# Patient Record
Sex: Female | Born: 1970 | Race: Black or African American | Hispanic: No | Marital: Married | Smoking: Never smoker
Health system: Southern US, Community
[De-identification: ages and names within clinical notes are randomized; demographics above are authoritative.]

## PROBLEM LIST (undated history)

## (undated) DIAGNOSIS — I10 Essential (primary) hypertension: Secondary | ICD-10-CM

## (undated) DIAGNOSIS — D649 Anemia, unspecified: Secondary | ICD-10-CM

## (undated) HISTORY — PX: OTHER SURGICAL HISTORY: SHX169

## (undated) HISTORY — PX: ABDOMINAL HYSTERECTOMY: SHX81

---

## 1998-11-18 ENCOUNTER — Ambulatory Visit (HOSPITAL_COMMUNITY): Admission: RE | Admit: 1998-11-18 | Discharge: 1998-11-18 | Payer: Self-pay | Admitting: Obstetrics and Gynecology

## 1998-11-21 ENCOUNTER — Encounter: Payer: Self-pay | Admitting: Obstetrics and Gynecology

## 1999-03-18 ENCOUNTER — Inpatient Hospital Stay (HOSPITAL_COMMUNITY): Admission: AD | Admit: 1999-03-18 | Discharge: 1999-03-18 | Payer: Self-pay | Admitting: Obstetrics and Gynecology

## 1999-03-24 ENCOUNTER — Inpatient Hospital Stay (HOSPITAL_COMMUNITY): Admission: AD | Admit: 1999-03-24 | Discharge: 1999-03-24 | Payer: Self-pay | Admitting: Obstetrics and Gynecology

## 1999-04-10 ENCOUNTER — Inpatient Hospital Stay (HOSPITAL_COMMUNITY): Admission: AD | Admit: 1999-04-10 | Discharge: 1999-04-13 | Payer: Self-pay | Admitting: Obstetrics and Gynecology

## 2001-08-17 ENCOUNTER — Other Ambulatory Visit: Admission: RE | Admit: 2001-08-17 | Discharge: 2001-08-17 | Payer: Self-pay | Admitting: Obstetrics and Gynecology

## 2002-03-29 ENCOUNTER — Inpatient Hospital Stay (HOSPITAL_COMMUNITY): Admission: AD | Admit: 2002-03-29 | Discharge: 2002-03-29 | Payer: Self-pay | Admitting: Obstetrics and Gynecology

## 2002-04-17 ENCOUNTER — Encounter: Payer: Self-pay | Admitting: Obstetrics and Gynecology

## 2002-04-17 ENCOUNTER — Ambulatory Visit (HOSPITAL_COMMUNITY): Admission: RE | Admit: 2002-04-17 | Discharge: 2002-04-17 | Payer: Self-pay | Admitting: Obstetrics and Gynecology

## 2002-09-07 ENCOUNTER — Encounter (INDEPENDENT_AMBULATORY_CARE_PROVIDER_SITE_OTHER): Payer: Self-pay

## 2002-09-07 ENCOUNTER — Inpatient Hospital Stay (HOSPITAL_COMMUNITY): Admission: AD | Admit: 2002-09-07 | Discharge: 2002-09-09 | Payer: Self-pay | Admitting: Obstetrics and Gynecology

## 2003-08-25 ENCOUNTER — Emergency Department (HOSPITAL_COMMUNITY): Admission: AD | Admit: 2003-08-25 | Discharge: 2003-08-25 | Payer: Self-pay | Admitting: Emergency Medicine

## 2004-05-09 ENCOUNTER — Other Ambulatory Visit: Admission: RE | Admit: 2004-05-09 | Discharge: 2004-05-09 | Payer: Self-pay | Admitting: Obstetrics and Gynecology

## 2005-06-05 ENCOUNTER — Other Ambulatory Visit: Admission: RE | Admit: 2005-06-05 | Discharge: 2005-06-05 | Payer: Self-pay | Admitting: Obstetrics and Gynecology

## 2008-07-19 HISTORY — PX: DILATION AND CURETTAGE OF UTERUS: SHX78

## 2008-07-20 ENCOUNTER — Encounter (INDEPENDENT_AMBULATORY_CARE_PROVIDER_SITE_OTHER): Payer: Self-pay | Admitting: Obstetrics and Gynecology

## 2008-07-20 ENCOUNTER — Ambulatory Visit (HOSPITAL_COMMUNITY): Admission: RE | Admit: 2008-07-20 | Discharge: 2008-07-20 | Payer: Self-pay | Admitting: Obstetrics and Gynecology

## 2011-03-03 NOTE — Op Note (Signed)
NAMESARRA, Tammy Brock                ACCOUNT NO.:  [id number]   MEDICAL RECORD NO.:  [id number]          PATIENT TYPE:  AMB   LOCATION:  SDC                           FACILITY:  [facility]   PHYSICIAN:  Zenaida Niece, M.D.DATE OF BIRTH:  [birth year]   DATE OF PROCEDURE:  07/20/2008  DATE OF DISCHARGE:                               OPERATIVE REPORT   PREOPERATIVE DIAGNOSES:  Abnormal uterine bleeding and endometrial  lesion.   POSTOPERATIVE DIAGNOSES:  Abnormal uterine bleeding and endometrial  lesion.   PROCEDURES:  Hysteroscopy with resection of endometrial lesion, dilation  and curettage, and NovaSure endometrial ablation.   SURGEON:  Zenaida Niece, MD   ANESTHESIA:  Monitored anesthesia care and paracervical block.   FINDINGS:  She had a large irregular endometrial lesion.  The uterus  sounded to 10 cm and cervix measured 3.5 cm, so the NovaSure device used  a depth of 6.5 cm, a width of 4.7 cm, and used 168 watts for 45 seconds.   SPECIMENS:  Endometrial resection and curettings sent to pathology.   ESTIMATED BLOOD LOSS:  Minimal.   FLUID DEFICIT:  Through the hysteroscope approximately 175 mL.   COMPLICATIONS:  None.   PROCEDURE IN DETAIL:  The patient was taken to the operating room and  placed in the dorsal supine position.  She was given IV sedation and  Toradol and placed in mobile stirrups.  Perineum and vagina were then  prepped and draped in the usual sterile fashion and bladder drained with  a latex-free catheter.  A Graves speculum was inserted into the vagina.  The anterior lip of the cervix was grasped with a single-tooth  tenaculum.  Paracervical block was then performed with 16 mL of 2% plain  lidocaine.  Uterus initially sounded to 7 cm, but after dilating sounded  to 10 cm.  The cervix was gradually easily dilated to a size 19 dilator  and the cervix measured 3.5 cm.  The observer hysteroscope was inserted  and fair visualization was achieved.  There was  a fair amount of  bleeding and there did appear to be a fairly large endometrial lesion.  This scope was removed and the resectoscope was put together and  inserted.  With this, I achieved better visualization and was able to  see a large irregular lesion arising from the posterior portion of the  uterus.  This was resected using a single loop with cutting current.  The lesion was removed mostly intact.  Hysteroscope was reinserted and  small pieces of the lesion were removed.  A small remainder was shaved  down to the remainder of the endometrial bed.  No further lesions were  identified.  The hysteroscope was removed.  Curettage was then performed  with return of a moderate amount of tissue, and the resection were sent  together.  Hysteroscope was reinserted and there were no further  lesions.  Sorbitol had been used for the resection, so the uterus was  now cleared with lactated Ringer's.  The hysteroscope was then removed.  The NovaSure device was then inserted and deployed appropriately.  The  CO2 test passed.  Endometrial ablation was performed with the above-  mentioned settings without difficulty.  The NovaSure device was then  removed intact.  Once the NovaSure was completed, the hysteroscope was  reinserted, good visualization was achieved.  It appeared that there was  good ablation of the entire cavity.  The single-tooth tenaculum was then  removed and bleeding controlled with pressure.  All instruments were  then removed from the vagina.  The patient was taken down from stirrups.  She tolerated the procedure well and was taken to the recovery room in  stable condition.  Counts were correct and she had PAS hose on  throughout the procedure.      Zenaida Niece, M.D.  Electronically Signed     TDM/MEDQ  D:  07/20/2008  T:  07/20/2008  Job:  [id number]

## 2011-03-06 NOTE — H&P (Signed)
NAME:  Tammy Brock, Tammy Brock                          ACCOUNT NO.:  [id number]   MEDICAL RECORD NO.:  [id number]                   PATIENT TYPE:  INP   LOCATION:  NA                                   FACILITY:  [facility]   PHYSICIAN:  Zenaida Niece, M.D.             DATE OF BIRTH:  [birth year]   DATE OF ADMISSION:  09/08/2002  DATE OF DISCHARGE:                                HISTORY & PHYSICAL   CHIEF COMPLAINT:  Repeat cesarean section.   HISTORY OF PRESENT ILLNESS:  This is a 40 year old black female gravida 3  para 1-1-0-2 with an EGA of [redacted] weeks by a seven-week ultrasound with a due  date of September 15, 2002 who presents for repeat cesarean section.  The  patient has one prior cesarean section, is cleared for a trial of labor, and  debated whether or not to have a vaginal delivery.  She has elected to  proceed with a repeat cesarean section.  She has had good fetal movement, no  bleeding or rupture of membranes, and occasional contractions.  Prenatal  care complicated by a first trimester urinary tract infection with E. coli  treated with Macrobid with a negative test of cure.  She was seen in the  emergency room at 15 weeks for abdominal pain and diarrhea which resolved.  She has had no further significant complications.   PRENATAL LABORATORY DATA:  Blood type O positive with a negative antibody  screen.  RPR nonreactive.  Rubella equivocal.  Hepatitis B surface antigen  negative.  HIV negative.  Gonorrhea and chlamydia negative.  Triple screen  normal.  One-hour Glucola is 83 and group B strep is negative.   PAST OBSTETRICAL HISTORY:  In 1991 she had a vaginal delivery at 34 weeks.  The baby weighed 4 pounds 8 ounces complicated by preterm labor.  In June  2000 she had a low transverse cesarean section at 39 weeks for arrest of  descent.  The baby weighed 7 pounds 13 ounces.   PAST MEDICAL HISTORY:  Negative.   PAST SURGICAL HISTORY:  In 1995 she had a ganglion cyst removed from  her  left wrist x2.   ALLERGIES:  None known.   CURRENT MEDICATIONS:  None.   SOCIAL HISTORY:  The patient is married and denies alcohol, tobacco, or drug  use.   FAMILY HISTORY:  Negative.   PHYSICAL EXAMINATION:  VITAL SIGNS:  Weight 183 pounds, blood pressure  118/70.  GENERAL:  This is a well-developed, well-nourished, gravida black female in  no acute distress.  NECK:  Supple without lymphadenopathy or thyromegaly.  LUNGS:  Clear to auscultation.  HEART:  Regular rate and rhythm without murmur.  ABDOMEN:  Gravid, nontender, with a fundal height of 37 cm and an estimated  fetal weight of 7 pounds.  She has a well-healed transverse scar.  EXTREMITIES:  Have no edema.  PELVIC:  Vaginal exam  reveals her cervix to be 4 cm dilated, 70% effaced,  and -2 station with a vertex presentation.   ASSESSMENT:  1. Intrauterine pregnancy at 39 weeks with history of prior cesarean     section.  2. Desires surgical sterility.   The patient wishes to undergo repeat cesarean section.  Risks of surgery  including bleeding, infection, and damage to surrounding organs have been  discussed with the patient and she wishes to proceed.  The patient  understands the permanency of tubal ligation and other options.   PLAN:  Admit the patient on the day of surgery for a repeat cesarean section  and tubal ligation.                                               Zenaida Niece, M.D.    TDM/MEDQ  D:  09/06/2002  T:  09/06/2002  Job:  [id number]

## 2011-03-06 NOTE — Discharge Summary (Signed)
NAME:  Tammy Brock, Tammy Brock                          ACCOUNT NO.:  [id number]   MEDICAL RECORD NO.:  [id number]                   PATIENT TYPE:  INP   LOCATION:  9134                                 FACILITY:  [facility]   PHYSICIAN:  Zenaida Niece, M.D.             DATE OF BIRTH:  1971-09-02   DATE OF ADMISSION:  09/07/2002  DATE OF DISCHARGE:  09/09/2002                                 DISCHARGE SUMMARY   ADMISSION DIAGNOSES:  1. Intrauterine pregnancy at 39 weeks.  2. Previous cesarean section.   DISCHARGE DIAGNOSES:  1. Intrauterine pregnancy at 39 weeks.  2. Previous cesarean section.   PROCEDURE:  Forceps-assisted VBAC.   HISTORY AND PHYSICAL:  This is a 40 year old black female gravida 3 para 1-1-  0-2 with an EGA of [redacted] weeks by a seven-week ultrasound with a due date of  September 15, 2002 who presented with the complaint of regular contractions.  She has a previous cesarean section and was scheduled for a repeat cesarean  section on November 21 but did wish to deliver vaginally if she went into  labor on her own.  She had no vaginal bleeding or ruptured membranes and  good fetal movement.  Prenatal care was otherwise uncomplicated except for  an  E. coli UTI treated with Macrobid.   PRENATAL LABORATORY DATA:  Blood type O positive with a negative antibody  screen.  RPR nonreactive.  Rubella equivocal.  Hepatitis B surface antigen  negative.  HIV negative.  Gonorrhea and chlamydia negative.  Triple screen  normal.  One-hour Glucola 83.  Group B strep negative.   PAST OBSTETRICAL HISTORY:  1. In 1991 a vaginal delivery at 34 weeks, 4 pounds 8 ounces, without     complications.  2. In June 2000 a cesarean section at term for arrest of descent.  The baby     weighed 7 pounds 13 ounces.   SURGICAL HISTORY:  Two ganglion cysts removed.   PHYSICAL EXAMINATION:  VITAL SIGNS:  She was afebrile with stable vital  signs.  ABDOMEN:  Soft with a fundal height of 37 cm.  Fetal heart  tracing was  normal.  PELVIC:  On Dr. Ebony Hail first exam she was 7 cm, complete, -2, with a  vertex presentation.  The patient had had spontaneous rupture of membranes  at 12:09 on November 20.   HOSPITAL COURSE:  The patient was admitted contracting on her own and had  the above-mentioned spontaneous rupture of membranes.  She did wish to  attempt a VBAC since she did enter labor on her own.  She was started on  Pitocin for augmentation.  Her progress was slightly protracted but then she  rapidly reached complete and had rapid descent.  Apparently there was fetal  bradycardia with the head at the perineum and Dr. Orlene Erm and Dr. Ambrose Mantle  performed a forceps-assisted vaginal delivery of a viable female infant that  weighed 7 pounds 3 ounces and had Apgars of 8 and 9.  A loop of cord  prolapsed in front of the posterior shoulder.  The placenta delivered  spontaneous and was intact and uterus palpated normal.  Uterine scar  palpated intact.  She had a second degree midline laceration which was  repaired with 2-0 Vicryl under local block and estimated blood loss was 400  cc.  Postpartum she did very well, remained afebrile, and breast fed her  baby without complications.  Predelivery hemoglobin 10.8, postdelivery 9.5.  Rubella status in the hospital was immune.  On the morning of postpartum day  #2 she was felt to be stable enough for discharge home.   DISCHARGE INSTRUCTIONS:  1. Regular diet.  2. Pelvic rest.  3. Follow up in six weeks.  4. Medicines are over-the-counter Motrin as needed.  5. She was given our discharge pamphlet.                                               Zenaida Niece, M.D.    TDM/MEDQ  D:  09/09/2002  T:  09/09/2002  Job:  [id number]

## 2011-07-20 LAB — BASIC METABOLIC PANEL
BUN: 8
CO2: 27
Calcium: 8.9
Chloride: 103
Creatinine, Ser: 0.67
GFR calc Af Amer: >60
GFR calc non Af Amer: >60
Glucose, Bld: 73
Potassium: 3.6
Sodium: 137

## 2011-07-20 LAB — CBC
HCT: 29.8 — ABNORMAL LOW
Hemoglobin: 9.5 — ABNORMAL LOW
MCHC: 31.8
MCV: 78.6
Platelets: 557 — ABNORMAL HIGH
RBC: 3.79 — ABNORMAL LOW
RDW: 19.5 — ABNORMAL HIGH
WBC: 7.4

## 2011-07-20 LAB — PREGNANCY, URINE: Preg Test, Ur: NEGATIVE

## 2011-08-17 ENCOUNTER — Inpatient Hospital Stay (HOSPITAL_COMMUNITY): Admission: RE | Admit: 2011-08-17 | Payer: Self-pay | Source: Ambulatory Visit | Admitting: Obstetrics and Gynecology

## 2011-08-17 ENCOUNTER — Encounter (HOSPITAL_COMMUNITY): Admission: RE | Payer: Self-pay | Source: Ambulatory Visit

## 2011-08-17 SURGERY — HYSTERECTOMY, SUPRACERVICAL, LAPAROSCOPIC
Anesthesia: General

## 2011-09-23 ENCOUNTER — Encounter (HOSPITAL_COMMUNITY): Payer: Self-pay | Admitting: Pharmacist

## 2011-09-24 NOTE — Patient Instructions (Addendum)
   Your procedure is scheduled ZO:[id number] December 20th  Enter through the Main Entrance of [facility] at:7:30am Pick up the phone at the desk and dial [phone] and inform us of your arrival.  Please call this number if you have any problems the morning of surgery: [phone]  Remember: Do not eat food after midnight: Wednesday Do not drink clear liquids after:midnight Wednesday Take these medicines the morning of surgery with a SIP OF WATER:none  Do not wear jewelry, make-up, or FINGER nail polish Do not wear lotions, powders, perfumes or deodorant. Do not shave 48 hours prior to surgery. Do not bring valuables to the hospital.  Leave suitcase in the car. After Surgery it may be brought to your room. For patients being admitted to the hospital, checkout time is 11:00am the day of discharge. If discharged home day of surgery, you will a ride home and it is advisable to have someone with you for 24 hours after surgery     Remember to use your hibiclens as instructed.Please shower with 1/2 bottle the evening before your surgery and the other 1/2 bottle the morning of surgery.

## 2011-09-28 ENCOUNTER — Encounter (HOSPITAL_COMMUNITY): Payer: Self-pay

## 2011-09-28 ENCOUNTER — Other Ambulatory Visit: Payer: Self-pay

## 2011-09-28 ENCOUNTER — Encounter (HOSPITAL_COMMUNITY)
Admission: RE | Admit: 2011-09-28 | Discharge: 2011-09-28 | Disposition: A | Discharge status: Home / Self Care | Payer: BC Managed Care – PPO | Source: Ambulatory Visit | Attending: Obstetrics and Gynecology | Admitting: Obstetrics and Gynecology

## 2011-09-28 HISTORY — DX: Anemia, unspecified: D64.9

## 2011-09-28 HISTORY — DX: Essential (primary) hypertension: I10

## 2011-09-28 LAB — CBC
HCT: 33.6 % — ABNORMAL LOW (ref 36.0–46.0)
MCV: 92.3 fL (ref 78.0–100.0)
RBC: 3.64 MIL/uL — ABNORMAL LOW (ref 3.87–5.11)
RDW: 13.2 % (ref 11.5–15.5)
WBC: 7 10*3/uL (ref 4.0–10.5)

## 2011-09-28 NOTE — Pre-Procedure Instructions (Signed)
Will do EKG at pat visit

## 2011-10-07 MED ORDER — CEFAZOLIN SODIUM-DEXTROSE 2-3 GM-% IV SOLR
2.0000 g | INTRAVENOUS | Status: AC
  Administered 2011-10-08: 2 g via INTRAVENOUS
  Filled 2011-10-07: qty 50

## 2011-10-08 ENCOUNTER — Ambulatory Visit (HOSPITAL_COMMUNITY)
Admission: RE | Admit: 2011-10-08 | Discharge: 2011-10-08 | Disposition: A | Discharge status: Home / Self Care | Payer: BC Managed Care – PPO | Source: Ambulatory Visit | Attending: Obstetrics and Gynecology | Admitting: Obstetrics and Gynecology

## 2011-10-08 ENCOUNTER — Other Ambulatory Visit: Payer: Self-pay | Admitting: Obstetrics and Gynecology

## 2011-10-08 ENCOUNTER — Encounter (HOSPITAL_COMMUNITY): Payer: Self-pay | Admitting: Anesthesiology

## 2011-10-08 ENCOUNTER — Ambulatory Visit (HOSPITAL_COMMUNITY): Payer: BC Managed Care – PPO | Admitting: Anesthesiology

## 2011-10-08 ENCOUNTER — Encounter (HOSPITAL_COMMUNITY)
Admission: RE | Disposition: A | Discharge status: Home / Self Care | Payer: Self-pay | Source: Ambulatory Visit | Attending: Obstetrics and Gynecology

## 2011-10-08 DIAGNOSIS — N8000 Endometriosis of the uterus, unspecified: Secondary | ICD-10-CM | POA: Insufficient documentation

## 2011-10-08 DIAGNOSIS — Z01818 Encounter for other preprocedural examination: Secondary | ICD-10-CM | POA: Insufficient documentation

## 2011-10-08 DIAGNOSIS — D259 Leiomyoma of uterus, unspecified: Secondary | ICD-10-CM | POA: Diagnosis present

## 2011-10-08 DIAGNOSIS — Z01812 Encounter for preprocedural laboratory examination: Secondary | ICD-10-CM | POA: Insufficient documentation

## 2011-10-08 HISTORY — PX: LAPAROSCOPIC SUPRACERVICAL HYSTERECTOMY: SHX5399

## 2011-10-08 LAB — CBC
HCT: 34.5 % — ABNORMAL LOW (ref 36.0–46.0)
MCV: 91 fL (ref 78.0–100.0)
Platelets: 404 10*3/uL — ABNORMAL HIGH (ref 150–400)
RBC: 3.79 MIL/uL — ABNORMAL LOW (ref 3.87–5.11)
WBC: 5.2 10*3/uL (ref 4.0–10.5)

## 2011-10-08 LAB — BASIC METABOLIC PANEL
CO2: 25 mEq/L (ref 19–32)
Calcium: 9.1 mg/dL (ref 8.4–10.5)
Chloride: 102 mEq/L (ref 96–112)
GFR calc Af Amer: >90 mL/min (ref >90)
GFR calc non Af Amer: >90 mL/min (ref >90)

## 2011-10-08 SURGERY — HYSTERECTOMY, SUPRACERVICAL, LAPAROSCOPIC
Anesthesia: General | Site: Uterus | Wound class: Clean Contaminated

## 2011-10-08 MED ORDER — AMLODIPINE BESYLATE 10 MG PO TABS
10.0000 mg | ORAL_TABLET | ORAL | Status: DC
  Administered 2011-10-08: 10 mg via ORAL
  Filled 2011-10-08: qty 1

## 2011-10-08 MED ORDER — DEXAMETHASONE SODIUM PHOSPHATE 4 MG/ML IJ SOLN
INTRAMUSCULAR | Status: DC | PRN
  Administered 2011-10-08: 5 mg via INTRAVENOUS

## 2011-10-08 MED ORDER — ONDANSETRON HCL 4 MG/2ML IJ SOLN
INTRAMUSCULAR | Status: DC | PRN
  Administered 2011-10-08: 4 mg via INTRAVENOUS

## 2011-10-08 MED ORDER — HYDROMORPHONE HCL PF 1 MG/ML IJ SOLN: 0.2500 mg | INTRAMUSCULAR | Status: DC | PRN

## 2011-10-08 MED ORDER — ONDANSETRON HCL 4 MG/2ML IJ SOLN: 4.0000 mg | Freq: Four times a day (QID) | INTRAMUSCULAR | Status: DC | PRN

## 2011-10-08 MED ORDER — SODIUM CHLORIDE 0.9 % IJ SOLN
INTRAMUSCULAR | Status: DC | PRN
  Administered 2011-10-08: 10 mL

## 2011-10-08 MED ORDER — ROCURONIUM BROMIDE 100 MG/10ML IV SOLN
INTRAVENOUS | Status: DC | PRN
  Administered 2011-10-08: 10 mg via INTRAVENOUS
  Administered 2011-10-08: 35 mg via INTRAVENOUS
  Administered 2011-10-08: 10 mg via INTRAVENOUS
  Administered 2011-10-08: 5 mg via INTRAVENOUS
  Administered 2011-10-08 (×2): 10 mg via INTRAVENOUS

## 2011-10-08 MED ORDER — SIMETHICONE 80 MG PO CHEW: 80.0000 mg | CHEWABLE_TABLET | Freq: Four times a day (QID) | ORAL | Status: DC | PRN

## 2011-10-08 MED ORDER — MIDAZOLAM HCL 5 MG/5ML IJ SOLN
INTRAMUSCULAR | Status: DC | PRN
  Administered 2011-10-08: 2 mg via INTRAVENOUS

## 2011-10-08 MED ORDER — PROPOFOL 10 MG/ML IV EMUL
INTRAVENOUS | Status: DC | PRN
  Administered 2011-10-08: 150 mg via INTRAVENOUS

## 2011-10-08 MED ORDER — ALUM & MAG HYDROXIDE-SIMETH 200-200-20 MG/5ML PO SUSP: 30.0000 mL | ORAL | Status: DC | PRN

## 2011-10-08 MED ORDER — HYDROMORPHONE HCL PF 1 MG/ML IJ SOLN
INTRAMUSCULAR | Status: DC | PRN
  Administered 2011-10-08 (×4): 0.5 mg via INTRAVENOUS

## 2011-10-08 MED ORDER — PHENYLEPHRINE HCL 10 MG/ML IJ SOLN
INTRAMUSCULAR | Status: DC | PRN
  Administered 2011-10-08 (×2): 40 ug via INTRAVENOUS
  Administered 2011-10-08 (×2): 80 ug via INTRAVENOUS
  Administered 2011-10-08 (×2): 40 ug via INTRAVENOUS
  Administered 2011-10-08: 80 ug via INTRAVENOUS

## 2011-10-08 MED ORDER — AMLODIPINE BESYLATE-VALSARTAN 10-160 MG PO TABS: 1.0000 | ORAL_TABLET | Freq: Every day | ORAL | Status: DC

## 2011-10-08 MED ORDER — DEXTROSE-NACL 5-0.45 % IV SOLN: INTRAVENOUS | Status: DC

## 2011-10-08 MED ORDER — OXYCODONE-ACETAMINOPHEN 5-325 MG PO TABS
1.0000 | ORAL_TABLET | ORAL | Status: DC | PRN
  Administered 2011-10-08 (×3): 1 via ORAL
  Filled 2011-10-08: qty 2
  Filled 2011-10-08 (×2): qty 1

## 2011-10-08 MED ORDER — NEOSTIGMINE METHYLSULFATE 1 MG/ML IJ SOLN
INTRAMUSCULAR | Status: DC | PRN
  Administered 2011-10-08: 3 mg via INTRAVENOUS

## 2011-10-08 MED ORDER — LACTATED RINGERS IV SOLN
INTRAVENOUS | Status: DC
  Administered 2011-10-08 (×4): via INTRAVENOUS

## 2011-10-08 MED ORDER — OLMESARTAN MEDOXOMIL 20 MG PO TABS
20.0000 mg | ORAL_TABLET | ORAL | Status: DC
  Administered 2011-10-08: 20 mg via ORAL
  Filled 2011-10-08: qty 1

## 2011-10-08 MED ORDER — FENTANYL CITRATE 0.05 MG/ML IJ SOLN
INTRAMUSCULAR | Status: DC | PRN
  Administered 2011-10-08: 50 ug via INTRAVENOUS
  Administered 2011-10-08: 100 ug via INTRAVENOUS
  Administered 2011-10-08 (×2): 50 ug via INTRAVENOUS

## 2011-10-08 MED ORDER — OXYCODONE-ACETAMINOPHEN 5-325 MG PO TABS: 1.0000 | ORAL_TABLET | ORAL | Status: AC | PRN

## 2011-10-08 MED ORDER — GLYCOPYRROLATE 0.2 MG/ML IJ SOLN
INTRAMUSCULAR | Status: DC | PRN
  Administered 2011-10-08: .6 mg via INTRAVENOUS

## 2011-10-08 MED ORDER — LACTATED RINGERS IR SOLN
Status: DC | PRN
  Administered 2011-10-08: 3000 mL

## 2011-10-08 MED ORDER — LIDOCAINE HCL (CARDIAC) 20 MG/ML IV SOLN
INTRAVENOUS | Status: DC | PRN
  Administered 2011-10-08: 80 mg via INTRAVENOUS

## 2011-10-08 MED ORDER — ONDANSETRON HCL 4 MG PO TABS: 4.0000 mg | ORAL_TABLET | Freq: Four times a day (QID) | ORAL | Status: DC | PRN

## 2011-10-08 MED ORDER — KETOROLAC TROMETHAMINE 30 MG/ML IJ SOLN
INTRAMUSCULAR | Status: DC | PRN
  Administered 2011-10-08: 60 mg via INTRAVENOUS

## 2011-10-08 MED ORDER — BUPIVACAINE HCL (PF) 0.25 % IJ SOLN
INTRAMUSCULAR | Status: DC | PRN
  Administered 2011-10-08: 10 mL

## 2011-10-08 MED ORDER — KETOROLAC TROMETHAMINE 30 MG/ML IJ SOLN: 30.0000 mg | Freq: Once | INTRAMUSCULAR | Status: DC

## 2011-10-08 SURGICAL SUPPLY — 34 items
ADH SKN CLS APL DERMABOND .7 (GAUZE/BANDAGES/DRESSINGS) ×2
BARRIER ADHS 3X4 INTERCEED (GAUZE/BANDAGES/DRESSINGS) IMPLANT
BLADE LAP MORCELLATOR 15X9.5 (ELECTROSURGICAL) ×2 IMPLANT
BLADE LAPAROSCOPIC MORCELL KIT (BLADE) ×1 IMPLANT
BRR ADH 4X3 ABS CNTRL BYND (GAUZE/BANDAGES/DRESSINGS)
CABLE HIGH FREQUENCY MONO STRZ (ELECTRODE) IMPLANT
CANISTER SUCTION 2500CC (MISCELLANEOUS) ×3 IMPLANT
CHLORAPREP W/TINT 26ML (MISCELLANEOUS) ×3 IMPLANT
CLOTH BEACON ORANGE TIMEOUT ST (SAFETY) ×3 IMPLANT
CONT PATH 16OZ SNAP LID 3702 (MISCELLANEOUS) ×3 IMPLANT
COVER MAYO STAND STRL (DRAPES) ×3 IMPLANT
DECANTER SPIKE VIAL GLASS SM (MISCELLANEOUS) ×2 IMPLANT
DERMABOND ADVANCED (GAUZE/BANDAGES/DRESSINGS) ×1
DERMABOND ADVANCED .7 DNX12 (GAUZE/BANDAGES/DRESSINGS) ×2 IMPLANT
EVACUATOR SMOKE 8.L (FILTER) ×4 IMPLANT
GLOVE BIO SURGEON STRL SZ8 (GLOVE) ×3 IMPLANT
GLOVE ORTHO TXT STRL SZ7.5 (GLOVE) ×3 IMPLANT
GOWN PREVENTION PLUS LG XLONG (DISPOSABLE) ×6 IMPLANT
NDL INSUFFLATION 14GA 120MM (NEEDLE) ×1 IMPLANT
NEEDLE INSUFFLATION 14GA 120MM (NEEDLE) ×3 IMPLANT
NS IRRIG 1000ML POUR BTL (IV SOLUTION) ×3 IMPLANT
PACK LAPAROSCOPY BASIN (CUSTOM PROCEDURE TRAY) ×3 IMPLANT
SCALPEL HARMONIC ACE (MISCELLANEOUS) ×2 IMPLANT
SET IRRIG TUBING LAPAROSCOPIC (IRRIGATION / IRRIGATOR) ×3 IMPLANT
SLEEVE Z-THREAD 5X100MM (TROCAR) ×3 IMPLANT
SUT VIC AB 3-0 PS2 18 (SUTURE) ×3
SUT VIC AB 3-0 PS2 18XBRD (SUTURE) ×2 IMPLANT
SUT VICRYL 0 UR6 27IN ABS (SUTURE) ×3 IMPLANT
TOWEL OR 17X24 6PK STRL BLUE (TOWEL DISPOSABLE) ×8 IMPLANT
TRAY FOLEY CATH 14FR (SET/KITS/TRAYS/PACK) ×3 IMPLANT
TROCAR Z-THREAD FIOS 12X100MM (TROCAR) ×3 IMPLANT
TROCAR Z-THREAD FIOS 5X100MM (TROCAR) ×3 IMPLANT
WARMER LAPAROSCOPE (MISCELLANEOUS) ×3 IMPLANT
WATER STERILE IRR 1000ML POUR (IV SOLUTION) ×3 IMPLANT

## 2011-10-08 NOTE — Preoperative (Signed)
Beta Blockers   Reason not to administer Beta Blockers:Not Applicable 

## 2011-10-08 NOTE — H&P (Signed)
Tammy Brock is an 40 y.o. female, P47. She was seen in the office in August for increasing symptoms from an enlarged uterus with at least one 9 cm myoma by u/s.  She is having more pelvic pressure, some urinary frequency from uterus pushing on bladder.  It has taken her awhile to decide on a time, but is ready to proceed with hysterectomy.  Pertinent Gynecological History: She is having regular menses every month, getting heavier and with more cramps. Last mammogram: normal Date: 11-11 Last pap: normal Date: 8-11 OB History: G3, P2103   Menstrual History:  No LMP recorded.    Past Medical History  Diagnosis Date  . Hypertension     maintained on exforge  . Anemia     Past Surgical History  Procedure Date  . Dilation and curettage of uterus 07/2008    ablation  . Ganglion cyst removal     wrist  . Cesarean section     No family history on file.  Social History:  reports that she has never smoked. She does not have any smokeless tobacco history on file. She reports that she does not drink alcohol or use illicit drugs.  Allergies: No Known Allergies  Prescriptions prior to admission  Medication Sig Dispense Refill  . amLODipine-valsartan (EXFORGE) 10-160 MG per tablet Take 1 tablet by mouth daily.        Marland Kitchen ibuprofen (ADVIL,MOTRIN) 200 MG tablet Take 800 mg by mouth every 8 (eight) hours as needed. For pain         Review of Systems  Respiratory: Negative.   Cardiovascular: Negative.   Gastrointestinal: Negative.   Genitourinary: Positive for frequency. Negative for dysuria, urgency and hematuria.    There were no vitals taken for this visit. Physical Exam  Constitutional: She appears well-developed and well-nourished.  Neck: Neck supple. No thyromegaly present.  Cardiovascular: Normal rate, regular rhythm and normal heart sounds.   No murmur heard. Respiratory: Effort normal and breath sounds normal. No respiratory distress. She has no wheezes.  GI: Soft. She  exhibits no distension. There is no tenderness.       Uterine fundus palpable at U-2  Genitourinary: Vagina normal.       Uterus about 16 weeks size No adnexal mass    No results found for this or any previous visit (from the past 24 hour(s)).  No results found.  Assessment/Plan: Symptomatic myomatous uterus.  All nonsurgical and surgical options have been discussed, I have recommended hysterectomy.  Hysterectomy procedure, risks, routes, alternatives, chances of alleviating symptoms, have all been discussed.  Will admit and attempt LSH.  She is aware that the uterus may be too bulky or problems may arise that necessitate TAH.    Tammy Brock D 10/08/2011, 7:53 AM

## 2011-10-08 NOTE — Anesthesia Postprocedure Evaluation (Signed)
Anesthesia Post Note  Patient: Tammy Brock  Procedure(s) Performed:  LAPAROSCOPIC SUPRACERVICAL HYSTERECTOMY  Anesthesia type: General  Patient location: PACU  Post pain: Pain level controlled  Post assessment: Post-op Vital signs reviewed  Last Vitals:  Filed Vitals:   10/08/11 1245  BP:   Pulse: 74  Temp:   Resp: 19    Post vital signs: Reviewed  Level of consciousness: sedated  Complications: No apparent anesthesia complications

## 2011-10-08 NOTE — Progress Notes (Signed)
Date of Initial H&P: 10-08-11  History reviewed, patient examined, no change in status, stable for surgery.

## 2011-10-08 NOTE — Op Note (Signed)
Preoperative diagnosis: Symptomatic fibroids Postoperative diagnosis: Same Procedure: Laparoscopic supracervical hysterectomy Surgeon: Lavina Hamman M.D. Assistant: Huel Cote M.D. Anesthesia: Gen. Endotracheal tube Findings: She had a 16 week size uterus with at least one large fibroid.  Normal abdomen, tubes and ovaries Specimens: Morcellated uterus for routine pathology Estimated blood loss: 400 cc Complications: None  Procedure in detail: The patient was taken to the operating room and placed in the dorsosupine position. General anesthesia was induced. Arms were tucked to her sides and legs were placed in mobile stirrups. Abdomen perineum and vagina were then prepped and draped in usual sterile fashion and a Foley catheter was inserted. Supraumbilical skin was infiltrated with quarter percent Marcaine and a 1 cm horizontal incision was made. A veress needle was inserted into the peritoneal cavity and placement confirmed by the water drop test an opening pressure of 4 mm of mercury. CO2 was insufflated to a pressure of 13 mm mercury and a veress needle was removed. A 5 mm trocar was then introduced with direct visualization with the laparoscope. A 5 mm port was then also placed on the right side under direct visualization. Inspection revealed the above-mentioned findings. A 12 mm port was placed on the left side under direct visualization. The left uterine cornu was grasped with a single-tooth tenaculum from the left side. The Harmonic scalpel Ace was used to take down the left round ligament, fallopian tube, utero-ovarian pedicle and broad ligament. The anterior peritoneum was incised across the anterior portion of the uterus to help release the bladder. Uterine artery artery was skeletonized and taken down with the harmonic scalpel Ace and bipolar cautery with adequate division and adequate hemostasis eventually.  There was at points brisk bleeding from the uterine artery. A similar procedure  was then performed on the patient's right side taking down the round ligament, utero-ovarian pedicle, fallopian tube and broad ligament. Anterior peritoneum was incised across the anterior portion the uterus to meet the incision coming from the patient's right side. Uterine artery was skeletonized and taken down with the Harmonic Scalpel with adequate division and adequate hemostasis. I then began to remove the uterus from the cervix using a drill, clamp, cut technique on maximum power, using the Harmonic scalpel Ace. This was done about halfway on the left side and then halfway on the right side removing the uterus from the cervix. The cervical stump appeared to be hemostatic. The 12 mm port was removed and the Storz morcellator was introduced with direct visualization. The uterus was removed via morcellation without difficulty after about 30 minutes due to the size of the uterus. All visible pieces were removed. The morcellator was removed and the 12 mm trocar was reintroduced. Pelvis was copiously irrigated. Small amount of bleeding from the cervical stump was controlled with the harmonic scalpel Ace. A piece of Interceed was placed over the cervical stump. At this point all pedicles appeared to be hemostatic and there was no other pathology noted. The 12 mm port was removed and I closed this with a 0 Vicryl suture while Dr. Senaida Ores watched from a 5 mm port to make sure did not include bowel in this closure. The remaining 5 mm ports were removed under direct visualization all gas was allowed to deflate from the abdomen. Skin incisions were closed with interrupted subcuticular sutures of 4-0 Vicryl followed by Dermabond. The patient tolerated the procedure well. She was taken to the recovery in stable condition. Counts were correct x2, she received Ancef 1 g IV the  beginning of the procedure and had PAS hose on throughout the procedure.

## 2011-10-08 NOTE — Anesthesia Preprocedure Evaluation (Signed)
Anesthesia Evaluation  Patient identified by MRN, date of birth, ID band Patient awake    Reviewed: Allergy & Precautions, H&P , NPO status , Patient's Chart, lab work & pertinent test results, reviewed documented beta blocker date and time   History of Anesthesia Complications Negative for: history of anesthetic complications  Airway Mallampati: I TM Distance: >3 FB Neck ROM: full    Dental  (+) Teeth Intact   Pulmonary neg pulmonary ROS,  clear to auscultation  Pulmonary exam normal       Cardiovascular Exercise Tolerance: Good hypertension, On Medications regular Normal    Neuro/Psych Negative Neurological ROS  Negative Psych ROS   GI/Hepatic negative GI ROS, Neg liver ROS,   Endo/Other  Negative Endocrine ROS  Renal/GU negative Renal ROS  Genitourinary negative   Musculoskeletal   Abdominal   Peds  Hematology negative hematology ROS (+)   Anesthesia Other Findings   Reproductive/Obstetrics negative OB ROS                           Anesthesia Physical Anesthesia Plan  ASA: II  Anesthesia Plan: General ETT   Post-op Pain Management:    Induction:   Airway Management Planned:   Additional Equipment:   Intra-op Plan:   Post-operative Plan:   Informed Consent: I have reviewed the patients History and Physical, chart, labs and discussed the procedure including the risks, benefits and alternatives for the proposed anesthesia with the patient or authorized representative who has indicated his/her understanding and acceptance.   Dental Advisory Given  Plan Discussed with: CRNA and Surgeon  Anesthesia Plan Comments:         Anesthesia Quick Evaluation

## 2011-10-08 NOTE — Transfer of Care (Signed)
Immediate Anesthesia Transfer of Care Note  Patient: Tammy Brock  Procedure(s) Performed:  LAPAROSCOPIC SUPRACERVICAL HYSTERECTOMY  Patient Location: PACU  Anesthesia Type: General  Level of Consciousness: awake, alert , oriented and patient cooperative  Airway & Oxygen Therapy: Patient Spontanous Breathing and Patient connected to nasal cannula oxygen  Post-op Assessment: Report given to PACU RN and Post -op Vital signs reviewed and stable  Post vital signs: Reviewed and stable  Complications: No apparent anesthesia complications

## 2011-10-14 ENCOUNTER — Encounter (HOSPITAL_COMMUNITY): Payer: Self-pay | Admitting: Obstetrics and Gynecology

## 2013-09-27 ENCOUNTER — Encounter (INDEPENDENT_AMBULATORY_CARE_PROVIDER_SITE_OTHER): Payer: Self-pay

## 2013-09-27 ENCOUNTER — Encounter (INDEPENDENT_AMBULATORY_CARE_PROVIDER_SITE_OTHER): Payer: Self-pay | Admitting: General Surgery

## 2013-09-27 ENCOUNTER — Ambulatory Visit (INDEPENDENT_AMBULATORY_CARE_PROVIDER_SITE_OTHER): Payer: BC Managed Care – PPO | Admitting: General Surgery

## 2013-09-27 VITALS — BP 120/77 | HR 83 | Temp 98.6°F | Resp 16 | Ht 64.0 in | Wt 187.4 lb

## 2013-09-27 DIAGNOSIS — K432 Incisional hernia without obstruction or gangrene: Secondary | ICD-10-CM

## 2013-09-27 NOTE — Patient Instructions (Signed)
Please call our office if you would like to schedule surgery  Hernia A hernia occurs when an internal organ pushes out through a weak spot in the abdominal wall. Hernias most commonly occur in the groin and around the navel. Hernias often can be pushed back into place (reduced). Most hernias tend to get worse over time. Some abdominal hernias can get stuck in the opening (irreducible or incarcerated hernia) and cannot be reduced. An irreducible abdominal hernia which is tightly squeezed into the opening is at risk for impaired blood supply (strangulated hernia). A strangulated hernia is a medical emergency. Because of the risk for an irreducible or strangulated hernia, surgery may be recommended to repair a hernia. CAUSES   Heavy lifting.  Prolonged coughing.  Straining to have a bowel movement.  A cut (incision) made during an abdominal surgery. HOME CARE INSTRUCTIONS   Bed rest is not required. You may continue your normal activities.  .  Cough gently. If you are a smoker it is best to stop. Even the best hernia repair can break down with the continual strain of coughing. Even if you do not have your hernia repaired, a cough will continue to aggravate the problem.  Do not wear anything tight over your hernia. Do not try to keep it in with an outside bandage or truss. These can damage abdominal contents if they are trapped within the hernia sac.  Eat a normal diet.  Avoid constipation. Straining over long periods of time will increase hernia size and encourage breakdown of repairs. If you cannot do this with diet alone, stool softeners may be used. SEEK IMMEDIATE MEDICAL CARE IF:   You have a fever.  You develop increasing abdominal pain.  You feel nauseous or vomit.  Your hernia is stuck outside the abdomen, looks discolored, feels hard, or is tender.  You have any changes in your bowel habits or in the hernia that are unusual for you.  You have increased pain or swelling  around the hernia.  You cannot push the hernia back in place by applying gentle pressure while lying down. MAKE SURE YOU:   Understand these instructions.  Will watch your condition.  Will get help right away if you are not doing well or get worse. Document Released: 10/05/2005 Document Revised: 12/28/2011 Document Reviewed: 05/24/2008 [facility] Patient Information 2014 [place], [place].

## 2013-09-27 NOTE — Progress Notes (Signed)
Patient ID: CAMYAH PULTZ, female   DOB: [birth year], 42 y.o.   MRN: [id number]  Chief Complaint  Patient presents with  . Hernia    HPI Tammy Brock is a 42 y.o. female.   HPI 42 year old [place] American female referred by Dr. Lavina Hamman for evaluation of umbilical hernia. She underwent a laparoscopic supracervical hysterectomy in December 2012. She states that she noticed a small bulge at her umbilicus during her pregnancy in 2000. She states that she has recently had some pressure and soreness at her bellybutton. This generally occurs with certain movements or activities. About 3-4 weeks ago she had a sore throat with lots of coughing which caused the area to be very sore at her bellybutton as well as hard. She denies noticing a firm hard bulge in that area. The skin has never been red. She denies any nausea, vomiting, diarrhea or constipation. She has had 2 prior C-sections. She denies any weight loss. She denies any melena or hematochezia. Past Medical History  Diagnosis Date  . Hypertension     maintained on exforge  . Anemia     Past Surgical History  Procedure Laterality Date  . Dilation and curettage of uterus  07/2008    ablation  . Ganglion cyst removal      wrist  . Cesarean section    . Laparoscopic supracervical hysterectomy  10/08/2011    Procedure: LAPAROSCOPIC SUPRACERVICAL HYSTERECTOMY;  Surgeon: Zenaida Niece, MD;  Location: [facility];  Service: Gynecology;  Laterality: N/A;  . Abdominal hysterectomy      History reviewed. No pertinent family history.  Social History History  Substance Use Topics  . Smoking status: Never Smoker   . Smokeless tobacco: Not on file  . Alcohol Use: No    No Known Allergies  Current Outpatient Prescriptions  Medication Sig Dispense Refill  . amLODipine-valsartan (EXFORGE) 10-160 MG per tablet Take 1 tablet by mouth daily.        Marland Kitchen ibuprofen (ADVIL,MOTRIN) 200 MG tablet Take 800 mg by mouth every 8 (eight) hours as  needed. For pain        No current facility-administered medications for this visit.    Review of Systems Review of Systems  Constitutional: Positive for chills. Negative for fever, activity change, appetite change and unexpected weight change.  HENT: Negative for nosebleeds and trouble swallowing.   Eyes: Negative for photophobia and visual disturbance.  Respiratory: Negative for chest tightness and shortness of breath.   Cardiovascular: Negative for chest pain and leg swelling.       Denies CP, SOB, orthopnea, PND, DOE  Genitourinary: Negative for dysuria and difficulty urinating.  Musculoskeletal: Negative for arthralgias.  Skin: Negative for pallor and rash.  Neurological: Negative for dizziness, seizures, facial asymmetry and numbness.       Denies TIA and amaurosis fugax   Hematological: Negative for adenopathy. Does not bruise/bleed easily.  Psychiatric/Behavioral: Negative for behavioral problems and agitation.    Blood pressure 120/77, pulse 83, temperature 98.6 F (37 C), temperature source Temporal, resp. rate 16, height 5\' 4"  (1.626 m), weight 187 lb 6.4 oz (85.004 kg).  Physical Exam Physical Exam  Vitals reviewed. Constitutional: She is oriented to person, place, and time. She appears well-developed and well-nourished. No distress.  HENT:  Head: Normocephalic and atraumatic.  Right Ear: External ear normal.  Left Ear: External ear normal.  Eyes: Conjunctivae are normal. No scleral icterus.  Neck: Normal range of motion. No tracheal deviation present.  Cardiovascular:  Normal rate and normal heart sounds.   Pulmonary/Chest: Effort normal and breath sounds normal. No stridor. No respiratory distress. She has no wheezes.  Abdominal: Soft. She exhibits no distension. There is no rebound and no guarding.    Small umbilical hernia only noticeable standing with valsalva. Tender in area with deep palpation. Old trocar scar at umbilicus. No cellulitis.   Musculoskeletal:  She exhibits no edema.  Neurological: She is alert and oriented to person, place, and time. She exhibits normal muscle tone.  Skin: Skin is warm and dry. No rash noted. She is not diaphoretic. No erythema.  Psychiatric: She has a normal mood and affect. Her behavior is normal. Judgment and thought content normal.    Data Reviewed Dr Meisinger's office note 09/12/13  Assessment    Umbilical incisional hernia     Plan    She has a very small incisional umbilical hernia probably around 1 cm in size. We discussed umbilical hernias.  We discussed the etiology of umbilical hernias. We discussed the signs and symptoms of incarceration and strangulation. The patient was given educational material. I also drew diagrams.  We discussed nonoperative and operative management. With respect to operative management, we discussed open repair   We discussed the risk and benefits of surgery including but not limited to bleeding, infection, injury to surrounding structures, hernia recurrence, mesh complications, hematoma/seroma formation, blood clot formation, urinary retention, post operative ileus, general anesthesia risk, abdominal pain. We discussed that this procedure can be quite uncomfortable and difficult to recover from based on how the mesh is secured to the abdominal wall. We discussed the importance of avoiding heavy lifting and straining for a period of 4-  6 weeks.  Since the hernia is very small she has elected to keep an eye on it for now. She would like to defer surgery at this time. I explained that she should let us know if the area is causing worsening discomfort, increasing in size, or if she would like to proceed with repair in the future. Otherwise followup as needed  Tammy Sella. Andrey Campanile, MD, FACS General, Bariatric, & Minimally Invasive Surgery [facility], [place]            [facility] M 09/27/2013, 11:06 AM

## 2013-10-09 ENCOUNTER — Encounter (INDEPENDENT_AMBULATORY_CARE_PROVIDER_SITE_OTHER): Payer: Self-pay

## 2013-11-23 ENCOUNTER — Encounter (HOSPITAL_COMMUNITY): Payer: Self-pay | Admitting: Emergency Medicine

## 2013-11-23 ENCOUNTER — Emergency Department (HOSPITAL_COMMUNITY): Payer: BC Managed Care – PPO

## 2013-11-23 ENCOUNTER — Emergency Department (HOSPITAL_COMMUNITY)
Admission: EM | Admit: 2013-11-23 | Discharge: 2013-11-23 | Disposition: A | Discharge status: Home / Self Care | Payer: BC Managed Care – PPO | Attending: Emergency Medicine | Admitting: Emergency Medicine

## 2013-11-23 DIAGNOSIS — M545 Low back pain, unspecified: Secondary | ICD-10-CM | POA: Insufficient documentation

## 2013-11-23 DIAGNOSIS — R6883 Chills (without fever): Secondary | ICD-10-CM | POA: Insufficient documentation

## 2013-11-23 DIAGNOSIS — Z791 Long term (current) use of non-steroidal anti-inflammatories (NSAID): Secondary | ICD-10-CM | POA: Insufficient documentation

## 2013-11-23 DIAGNOSIS — R109 Unspecified abdominal pain: Secondary | ICD-10-CM | POA: Insufficient documentation

## 2013-11-23 DIAGNOSIS — M549 Dorsalgia, unspecified: Secondary | ICD-10-CM

## 2013-11-23 DIAGNOSIS — Z79899 Other long term (current) drug therapy: Secondary | ICD-10-CM | POA: Insufficient documentation

## 2013-11-23 DIAGNOSIS — Z862 Personal history of diseases of the blood and blood-forming organs and certain disorders involving the immune mechanism: Secondary | ICD-10-CM | POA: Insufficient documentation

## 2013-11-23 DIAGNOSIS — Z90710 Acquired absence of both cervix and uterus: Secondary | ICD-10-CM | POA: Insufficient documentation

## 2013-11-23 DIAGNOSIS — I10 Essential (primary) hypertension: Secondary | ICD-10-CM | POA: Insufficient documentation

## 2013-11-23 LAB — URINALYSIS, ROUTINE W REFLEX MICROSCOPIC
Bilirubin Urine: NEGATIVE
GLUCOSE, UA: NEGATIVE mg/dL
Hgb urine dipstick: NEGATIVE
Ketones, ur: NEGATIVE mg/dL
LEUKOCYTES UA: NEGATIVE
Nitrite: NEGATIVE
PH: 6 (ref 5.0–8.0)
PROTEIN: NEGATIVE mg/dL
SPECIFIC GRAVITY, URINE: 1.027 (ref 1.005–1.030)
Urobilinogen, UA: 1 mg/dL (ref 0.0–1.0)

## 2013-11-23 MED ORDER — METHOCARBAMOL 500 MG PO TABS
500.0000 mg | ORAL_TABLET | Freq: Two times a day (BID) | ORAL | Status: DC
Start: 2013-11-23 — End: 2018-02-09

## 2013-11-23 MED ORDER — IBUPROFEN 800 MG PO TABS: 800.0000 mg | ORAL_TABLET | Freq: Three times a day (TID) | ORAL | Status: DC

## 2013-11-23 MED ORDER — KETOROLAC TROMETHAMINE 60 MG/2ML IM SOLN
60.0000 mg | Freq: Once | INTRAMUSCULAR | Status: AC
  Administered 2013-11-23: 60 mg via INTRAMUSCULAR
  Filled 2013-11-23: qty 2

## 2013-11-23 NOTE — ED Provider Notes (Signed)
CSN: [id number]     Arrival date & time 11/23/13  1044 History   First MD Initiated Contact with Patient 11/23/13 1103     Chief Complaint  Patient presents with  . Back Pain    low back pain x 36 hrs  . Chills   (Consider location/radiation/quality/duration/timing/severity/associated sxs/prior Treatment) The history is provided by the patient. No language interpreter was used.  Tammy Brock is a 43 year old female with past medical history of hypertension and anemia presenting to emergency department with lower back pain that started yesterday. Patient reported that when she woke up yesterday morning she had lower back discomfort described as a muscle ache-thought she slept wrong. Stated that later that day the pain got progressively worse described as a sharp pain without radiation. Stated when she has episodes of sharp pain she gets short of breath secondary to discomfort in her lower back. Denied radiation of pain. Stated that she's been experiencing chills. Reported that she took a warm shower in order to have warm water on her back with negative relief. Denied taking any over-the-counter medications. Patient reported that she was seen and assessed by her primary care provider this morning who tried to get the patient an x-ray appointment, was unable to do so as an outpatient recommended patient to come to emergency department. Denied urinary symptoms, dysuria, bowel movement changes, urinary incontinence, bowel incontinence, fall, injuries, fever, sweats, numbness, tingling, dizziness, weakness, blurred vision, chest pain, shortness of breath, difficulty breathing, history kidney stones. Stated that she did have history of kidney infection. PCP Dr. Chapman Fitch  Past Medical History  Diagnosis Date  . Hypertension     maintained on exforge  . Anemia    Past Surgical History  Procedure Laterality Date  . Dilation and curettage of uterus  07/2008    ablation  . Ganglion cyst removal      wrist    . Cesarean section    . Laparoscopic supracervical hysterectomy  10/08/2011    Procedure: LAPAROSCOPIC SUPRACERVICAL HYSTERECTOMY;  Surgeon: Clarene Duke, MD;  Location: [facility];  Service: Gynecology;  Laterality: N/A;  . Abdominal hysterectomy     Family History  Problem Relation Age of Onset  . Hypertension Mother   . Cancer Mother   . Hypertension Father    History  Substance Use Topics  . Smoking status: Never Smoker   . Smokeless tobacco: Not on file  . Alcohol Use: No   OB History   Grav Para Term Preterm Abortions TAB SAB Ect Mult Living                 Review of Systems  Constitutional: Positive for chills. Negative for fever.  Eyes: Negative for visual disturbance.  Respiratory: Negative for chest tightness and shortness of breath.   Cardiovascular: Negative for chest pain.  Gastrointestinal: Negative for nausea, vomiting, abdominal pain and diarrhea.  Genitourinary: Positive for flank pain. Negative for dysuria and decreased urine volume.  Musculoskeletal: Positive for back pain. Negative for neck pain.  Neurological: Negative for dizziness, weakness and numbness.  All other systems reviewed and are negative.    Allergies  Review of patient's allergies indicates no known allergies.  Home Medications   Current Outpatient Rx  Name  Route  Sig  Dispense  Refill  . amLODipine-valsartan (EXFORGE) 10-160 MG per tablet   Oral   Take 1 tablet by mouth every morning.          Marland Kitchen ibuprofen (ADVIL,MOTRIN) 800 MG tablet  Oral   Take 1 tablet (800 mg total) by mouth 3 (three) times daily.   21 tablet   0   . methocarbamol (ROBAXIN) 500 MG tablet   Oral   Take 1 tablet (500 mg total) by mouth 2 (two) times daily.   20 tablet   0    BP 144/85  Pulse 79  Temp(Src) 98.2 F (36.8 C) (Oral)  Resp 20  Wt 190 lb (86.183 kg)  SpO2 100%  LMP 09/21/2011 Physical Exam  Nursing note and vitals reviewed. Constitutional: She is oriented to person, place, and  time. She appears well-developed and well-nourished. No distress.  HENT:  Head: Normocephalic and atraumatic.  Eyes: Conjunctivae and EOM are normal. Pupils are equal, round, and reactive to light. Right eye exhibits no discharge. Left eye exhibits no discharge.  Neck: Normal range of motion. Neck supple.  Cardiovascular: Normal rate, regular rhythm and normal heart sounds.  Exam reveals no friction rub.   No murmur heard. Pulses:      Radial pulses are 2+ on the right side, and 2+ on the left side.       Dorsalis pedis pulses are 2+ on the right side, and 2+ on the left side.  Pulmonary/Chest: Effort normal and breath sounds normal. No respiratory distress. She has no wheezes. She has no rales.  Musculoskeletal: Normal range of motion. She exhibits tenderness.       Back:  Negative swelling, erythema, inflammation, lesions, sores, bulging, deformities noted to the cervical/thoracic/lumbosacral/coccyx region of the spine. Discomfort upon palpation to lower thoracic/upper lumbar paraspinal regions bilaterally. Muscular in nature. Full range of motion to upper and lower extremities bilaterally without difficulty noted-negative ataxia with motion. Positive CVA tenderness bilaterally  Neurological: She is alert and oriented to person, place, and time. She exhibits normal muscle tone. Coordination normal.  Cranial nerves III-XII grossly intact Strength 5+/5+ to upper and lower extremities bilaterally with resistance applied, equal distribution noted Equal grip strength Negative bilateral saddle paresthesias Sensation intact with differentiation to sharp and dull touch to upper and lower extremities bilaterally Negative pronator drift Fine motor skills intact Gait proper, proper balance - negative sway, negative drift, negative step-offs  Skin: Skin is warm and dry. No rash noted. She is not diaphoretic. No erythema.  Psychiatric: She has a normal mood and affect. Her behavior is normal. Thought  content normal.    ED Course  Procedures (including critical care time)  Results for orders placed during the hospital encounter of 11/23/13  URINALYSIS, ROUTINE W REFLEX MICROSCOPIC      Result Value Range   Color, Urine YELLOW  YELLOW   APPearance CLEAR  CLEAR   Specific Gravity, Urine 1.027  1.005 - 1.030   pH 6.0  5.0 - 8.0   Glucose, UA NEGATIVE  NEGATIVE mg/dL   Hgb urine dipstick NEGATIVE  NEGATIVE   Bilirubin Urine NEGATIVE  NEGATIVE   Ketones, ur NEGATIVE  NEGATIVE mg/dL   Protein, ur NEGATIVE  NEGATIVE mg/dL   Urobilinogen, UA 1.0  0.0 - 1.0 mg/dL   Nitrite NEGATIVE  NEGATIVE   Leukocytes, UA NEGATIVE  NEGATIVE   Dg Chest 2 View  11/23/2013   CLINICAL DATA:  Shortness of breath and chest pain  EXAM: CHEST  2 VIEW  COMPARISON:  None.  FINDINGS: There is minimal scarring in the left lower lobe. Lungs are otherwise clear. Heart is upper normal in size with normal pulmonary vascularity. No adenopathy. There is upper thoracic levoscoliosis.  IMPRESSION:  No edema or consolidation.   Electronically Signed   By: Lowella Grip M.D.   On: 11/23/2013 12:34   Dg Thoracic Spine 2 View  11/23/2013   CLINICAL DATA:  Pain  EXAM: THORACIC SPINE - 3 VIEW  COMPARISON:  None.  FINDINGS: Frontal, lateral, and swimmer's views were obtained. There is upper thoracic levoscoliosis. There is no fracture or spondylolisthesis. There is mild disc narrowing at several levels in the mid thoracic region. No erosive change.  IMPRESSION: Scoliosis and mild osteoarthritic change. No fracture or spondylolisthesis.   Electronically Signed   By: Lowella Grip M.D.   On: 11/23/2013 12:22   Dg Lumbar Spine Complete  11/23/2013   CLINICAL DATA:  Low back pain  EXAM: LUMBAR SPINE - COMPLETE 4+ VIEW  COMPARISON:  None.  FINDINGS: Frontal, lateral, spot lumbosacral lateral, and bilateral oblique views were obtained. There are 5 non-rib-bearing lumbar type vertebral bodies. There is lumbar levoscoliosis. No fracture  or spondylolisthesis. Disc spaces appear intact. No appreciable facet arthropathy.  IMPRESSION: Scoliosis.  No fracture or appreciable arthropathy.   Electronically Signed   By: Lowella Grip M.D.   On: 11/23/2013 12:22   Labs Review Labs Reviewed  URINALYSIS, ROUTINE W REFLEX MICROSCOPIC   Imaging Review Dg Chest 2 View  11/23/2013   CLINICAL DATA:  Shortness of breath and chest pain  EXAM: CHEST  2 VIEW  COMPARISON:  None.  FINDINGS: There is minimal scarring in the left lower lobe. Lungs are otherwise clear. Heart is upper normal in size with normal pulmonary vascularity. No adenopathy. There is upper thoracic levoscoliosis.  IMPRESSION: No edema or consolidation.   Electronically Signed   By: Lowella Grip M.D.   On: 11/23/2013 12:34   Dg Thoracic Spine 2 View  11/23/2013   CLINICAL DATA:  Pain  EXAM: THORACIC SPINE - 3 VIEW  COMPARISON:  None.  FINDINGS: Frontal, lateral, and swimmer's views were obtained. There is upper thoracic levoscoliosis. There is no fracture or spondylolisthesis. There is mild disc narrowing at several levels in the mid thoracic region. No erosive change.  IMPRESSION: Scoliosis and mild osteoarthritic change. No fracture or spondylolisthesis.   Electronically Signed   By: Lowella Grip M.D.   On: 11/23/2013 12:22   Dg Lumbar Spine Complete  11/23/2013   CLINICAL DATA:  Low back pain  EXAM: LUMBAR SPINE - COMPLETE 4+ VIEW  COMPARISON:  None.  FINDINGS: Frontal, lateral, spot lumbosacral lateral, and bilateral oblique views were obtained. There are 5 non-rib-bearing lumbar type vertebral bodies. There is lumbar levoscoliosis. No fracture or spondylolisthesis. Disc spaces appear intact. No appreciable facet arthropathy.  IMPRESSION: Scoliosis.  No fracture or appreciable arthropathy.   Electronically Signed   By: Lowella Grip M.D.   On: 11/23/2013 12:22    EKG Interpretation   None       MDM   1. Back pain     Filed Vitals:   11/23/13 1102  BP:  144/85  Pulse: 79  Temp: 98.2 F (36.8 C)  TempSrc: Oral  Resp: 20  Weight: 190 lb (86.183 kg)  SpO2: 100%    Patient presenting to emergency department with lower back pain that started yesterday. Reported that yesterday morning it was more of a muscular ache that has now turned into a sharp sensation without radiation. Stated that episodes of pain she gets short of breath. Reported that she was seen and assessed by her primary care provider this morning who obtained a urine sample-was recommended  to get a x-ray as an outpatient but was unable to sit up an appointment, patient was recommended to come to the emergency department. Alert and oriented. GCS 15. Heart rate and rhythm normal. Lungs clear to auscultation. Radial and DP pulses 2+ bilaterally. Negative deformities noted to the cervical/thoracic/lumbosacral suspect of each regions of the spine. Discomfort upon lower thoracic and upper lumbar paraspinal regions bilaterally. Positive CVA tenderness bilaterally. Negative ecchymosis noted. Full range of motion to upper and lower extremities bilaterally without difficulty or ataxia noted. Negative neck stiffness, negative nuchal rigidity. Sensation intact. Equal grip strength. Negative focal neurological deficits noted. Gait proper, proper balance and-negative sway. Negative step off. Urinalysis negative for infection-negative signs of hemoglobin, nitrites or leukocytes. Plain film of lumbar spine noted scoliosis-negative acute bony fractures or abnormalities noted. Thoracic spine plain film scoliosis noted with osteophytic change-negative fractures noted-negative bony abnormalities. Chest x-ray negative for acute abnormalities. Doubt pneumonia. Doubt pneumothorax. Doubt pyelonephritis. Doubt kidney stones. Doubt urinary tract infection. Doubt cauda equina. Doubt epidural abscess. Suspicion to be muscular discomfort secondary to pain with motion and pain upon palpation. Patient stable, afebrile.  Negative sepsis at this point. Discharged patient. Discharged patient with small dose of muscle relaxers and anti-inflammatories. Referred patient to orthopedics and primary care provider. Discussed with patient to rest and stay hydrated. Discussed with patient to avoid any physical or strenuous activity. Discussed with patient to closely monitor symptoms and if symptoms are to worsen or change to report back to the ED - strict return instructions given.  Patient agreed to plan of care, understood, all questions answered.   Jamse Mead, PA-C 11/24/13 1459

## 2013-11-23 NOTE — ED Notes (Signed)
Pt seen by PCP this am for c/o increased sharp pain in low back on inspiration. Pt also c/o chills since last night. Pt reported that she has not taken her hypertension meds x 2 days.

## 2013-11-23 NOTE — Discharge Instructions (Signed)
Please call your doctor for a followup appointment within 24-48 hours. When you talk to your doctor please let them know that you were seen in the emergency department and have them acquire all of your records so that they can discuss the findings with you and formulate a treatment plan to fully care for your new and ongoing problems. Please call and set-up an appointment with orthopedics to be assessed regarding back discomfort Please rest and stay hydrated Please avoid any physical or strenuous activity Please take medications as prescribed and on a full stomach Please apply warm compressions or icy-hot ointment to the lower back and massage to aid in muscular relief Please continue to monitor symptoms closely and if symptoms are to worsen or change (fever greater than 101, chills, neck pain, neck stiffness, chest pain, shortness of breath, difficulty breathing, numbness, tingling, inability wall, pain radiating down the legs, loss of sensation, inability to control urine or bowel movements, fall, injury) please report back to emergency department immediately  Back Pain, Adult Low back pain is very common. About 1 in 5 people have back pain.The cause of low back pain is rarely dangerous. The pain often gets better over time.About half of people with a sudden onset of back pain feel better in just 2 weeks. About 8 in 10 people feel better by 6 weeks.  CAUSES Some common causes of back pain include:  Strain of the muscles or ligaments supporting the spine.  Wear and tear (degeneration) of the spinal discs.  Arthritis.  Direct injury to the back. DIAGNOSIS Most of the time, the direct cause of low back pain is not known.However, back pain can be treated effectively even when the exact cause of the pain is unknown.Answering your caregiver's questions about your overall health and symptoms is one of the most accurate ways to make sure the cause of your pain is not dangerous. If your caregiver  needs more information, he or she may order lab work or imaging tests (X-rays or MRIs).However, even if imaging tests show changes in your back, this usually does not require surgery. HOME CARE INSTRUCTIONS For many people, back pain returns.Since low back pain is rarely dangerous, it is often a condition that people can learn to [facility] their own.   Remain active. It is stressful on the back to sit or stand in one place. Do not sit, drive, or stand in one place for more than 30 minutes at a time. Take short walks on level surfaces as soon as pain allows.Try to increase the length of time you walk each day.  Do not stay in bed.Resting more than 1 or 2 days can delay your recovery.  Do not avoid exercise or work.Your body is made to move.It is not dangerous to be active, even though your back may hurt.Your back will likely heal faster if you return to being active before your pain is gone.  Pay attention to your body when you bend and lift. Many people have less discomfortwhen lifting if they bend their knees, keep the load close to their bodies,and avoid twisting. Often, the most comfortable positions are those that put less stress on your recovering back.  Find a comfortable position to sleep. Use a firm mattress and lie on your side with your knees slightly bent. If you lie on your back, put a pillow under your knees.  Only take over-the-counter or prescription medicines as directed by your caregiver. Over-the-counter medicines to reduce pain and inflammation are often the  most helpful.Your caregiver may prescribe muscle relaxant drugs.These medicines help dull your pain so you can more quickly return to your normal activities and healthy exercise.  Put ice on the injured area.  Put ice in a plastic bag.  Place a towel between your skin and the bag.  Leave the ice on for 15-20 minutes, 03-04 times a day for the first 2 to 3 days. After that, ice and heat may be alternated to  reduce pain and spasms.  Ask your caregiver about trying back exercises and gentle massage. This may be of some benefit.  Avoid feeling anxious or stressed.Stress increases muscle tension and can worsen back pain.It is important to recognize when you are anxious or stressed and learn ways to manage it.Exercise is a great option. SEEK MEDICAL CARE IF:  You have pain that is not relieved with rest or medicine.  You have pain that does not improve in 1 week.  You have new symptoms.  You are generally not feeling well. SEEK IMMEDIATE MEDICAL CARE IF:   You have pain that radiates from your back into your legs.  You develop new bowel or bladder control problems.  You have unusual weakness or numbness in your arms or legs.  You develop nausea or vomiting.  You develop abdominal pain.  You feel faint. Document Released: 10/05/2005 Document Revised: 04/05/2012 Document Reviewed: 02/23/2011 [facility] Patient Information 2014 [place], [place].   Back Exercises Back exercises help treat and prevent back injuries. The goal of back exercises is to increase the strength of your abdominal and back muscles and the flexibility of your back. These exercises should be started when you no longer have back pain. Back exercises include:  Pelvic Tilt. Lie on your back with your knees bent. Tilt your pelvis until the lower part of your back is against the floor. Hold this position 5 to 10 sec and repeat 5 to 10 times.  Knee to Chest. Pull first 1 knee up against your chest and hold for 20 to 30 seconds, repeat this with the other knee, and then both knees. This may be done with the other leg straight or bent, whichever feels better.  Sit-Ups or Curl-Ups. Bend your knees 90 degrees. Start with tilting your pelvis, and do a partial, slow sit-up, lifting your trunk only 30 to 45 degrees off the floor. Take at least 2 to 3 seconds for each sit-up. Do not do sit-ups with your knees out straight. If partial  sit-ups are difficult, simply do the above but with only tightening your abdominal muscles and holding it as directed.  Hip-Lift. Lie on your back with your knees flexed 90 degrees. Push down with your feet and shoulders as you raise your hips a couple inches off the floor; hold for 10 seconds, repeat 5 to 10 times.  Back arches. Lie on your stomach, propping yourself up on bent elbows. Slowly press on your hands, causing an arch in your low back. Repeat 3 to 5 times. Any initial stiffness and discomfort should lessen with repetition over time.  Shoulder-Lifts. Lie face down with arms beside your body. Keep hips and torso pressed to floor as you slowly lift your head and shoulders off the floor. Do not overdo your exercises, especially in the beginning. Exercises may cause you some mild back discomfort which lasts for a few minutes; however, if the pain is more severe, or lasts for more than 15 minutes, do not continue exercises until you see your caregiver. Improvement with  exercise therapy for back problems is slow.  See your caregivers for assistance with developing a proper back exercise program. Document Released: 11/12/2004 Document Revised: 12/28/2011 Document Reviewed: 08/06/2011 [facility] Patient Information 2014 [place], [place].   Emergency Department Resource Guide 1) Find a Doctor and Pay Out of Pocket Although you won't have to find out who is covered by your insurance plan, it is a good idea to ask around and get recommendations. You will then need to call the office and see if the doctor you have chosen will accept you as a new patient and what types of options they offer for patients who are self-pay. Some doctors offer discounts or will set up payment plans for their patients who do not have insurance, but you will need to ask so you aren't surprised when you get to your appointment.  2) Contact Your Local Health Department Not all health departments have doctors that can see  patients for sick visits, but many do, so it is worth a call to see if yours does. If you don't know where your local health department is, you can check in your phone book. The CDC also has a tool to help you locate your state's health department, and many state websites also have listings of all of their local health departments.  3) Find a Walk-in Clinic If your illness is not likely to be very severe or complicated, you may want to try a walk in clinic. These are popping up all over the country in pharmacies, drugstores, and shopping centers. They're usually staffed by nurse practitioners or physician assistants that have been trained to treat common illnesses and complaints. They're usually fairly quick and inexpensive. However, if you have serious medical issues or chronic medical problems, these are probably not your best option.  No Primary Care Doctor: - Call Health Connect at  [phone] - they can help you locate a primary care doctor that  accepts your insurance, provides certain services, etc. - Physician Referral Service- [phone]  Chronic Pain Problems: Organization         Address  Phone   Notes  [facility]  [phone] Patients need to be referred by their primary care doctor.   Medication Assistance: Organization         Address  Phone   Notes  [facility] [address] [place] [place], [place] [address] [phone] --Must be a resident of [facility] -- Must have NO insurance coverage whatsoever (no Medicaid/ Medicare, etc.) -- The pt. MUST have a primary care doctor that directs their care regularly and follows them in the community   [facility]ist  [phone]   [employer]  [phone]    Agencies that provide inexpensive medical care: Organization         Address  Phone   Notes  [facility]  [phone]   [facility]    [phone]   [facility] [address] [place], [place] [address] [phone]   [facility] [address] [place] [place] [phone]   Planned Parenthood    [phone]   [facility]    [phone]   [facility]  [address], [place] Phone:  [phone], Fax:  [phone] Hours of Operation:  9 am - 6 pm, M-F.  Also accepts Medicaid/Medicare and self-pay.  [facility]  [address], [place] Phone: [phone]) [phone], Fax: [phone]. Hours of Operation:  8:30 am - 5:30 pm, M-F.  Also accepts Medicaid and self-pay.  [facility] [place] [address], [place] Phone: [phone](336) [phone]   [facility] [address], [place], [place] [phone], Ext. [phone] Mondays & Thursdays: 7-9 AM.  First 15 patients are seen on a first come, first serve basis.    Medicaid-accepting [facility] Providers:  Organization         Address  Phone   Notes  [facility] [address], [place] [phone] Also accepts self-pay patients.  [facility] [address], [place]  [phone]) [phone]   [facility] [address], [place] [phone]   [facility] [address] [place] [address], [place] [phone](336) [phone]   Renaye RakersVeita Bland [address], [place]   [phone] Only accepts [place] Access [place] patients after they have their name applied to their card.   Self-Pay (no insurance) in [facility]:  Organization         Address  Phone   Notes  Sickle Cell Patients, [facility] [address], [place] [phone]) [phone]   [facility] [address], [place] [phone]   [facility] [place]  [address], [place] [phone]   [facility]/Dr. Osei-Bonsu   [address] [place] [address], [place] or [phone], [place] [phone]) [phone] Phone number for both [place] and [place] locations is the same.  [facility] [address], [place] [phone]   [facility] [address] [place] [address], [place] or [address] [phone]   [facility] [address], [place] [phone]) [phone], phone; [phone](336) [phone], fax Sees patients 1st and 3rd Saturday of every month.  Must not qualify for public or private insurance (i.e. Medicaid, Medicare, [place] Health Choice, Veterans' Benefits)  Household income should be no more than 200% of the poverty level The clinic cannot treat you if you are pregnant or think you are pregnant  Sexually transmitted diseases are not treated at the clinic.    Dental Care: Organization         Address  Phone  Notes  [facility] [address], [place] [phone] Accepts children up to age 45 who are enrolled in [place] or [place] Health Choice; pregnant women with a Medicaid card; and children who have applied for Medicaid or [place] Health Choice, but were declined, whose parents can pay a reduced fee at time of service.  [facility] [place]  [address], [place] [phone]) [phone] Accepts children up to age 45 who are enrolled in [place] or [place] Health Choice; pregnant women with a Medicaid card; and children who have applied for Medicaid or [place] Health Choice, but were declined, whose parents can pay a reduced fee at time of service.  [facility]  [address], [place] [phone]) [phone] Patients are seen by appointment only. Walk-ins are not accepted. [facility] will see patients 45 years of age and older. Monday - Tuesday (8am-5pm) Most Wednesdays (8:30-5pm) $30 per visit, cash only  [facility]  [address], [facility] [phone] Patients are seen by appointment only. Walk-ins are not accepted. [facility] will see patients 63 years of age and older. One Wednesday Evening (Monthly: Volunteer Based).  $30 per visit, cash only  [employer]  [phone] for adults; Children under age 53, call Graduate Pediatric Dentistry at [phone]. Children aged 58-14, please call [phone] to request a pediatric application.  Dental services are provided in all areas of dental care including fillings, crowns and bridges, complete and partial dentures, implants, gum treatment, root canals, and extractions. Preventive care is also provided. Treatment is provided to both adults and children. Patients are selected via a lottery and there is often a waiting list.   [facility] [address], [place]  [phone] [email or web address]   [facility] [address] [place], [place] [phone], Ext. [phone] Second and Fourth Thursday of each month, opens at 6:30 AM; Clinic ends at 9 AM.  Patients are seen on a first-come first-served basis, and a limited number are seen during each clinic.   [facility]  [address] [place], [place] [phone]   Eligibility Requirements You must have lived in [place], [place], or [place] counties for at least the last three months.   You cannot be eligible for state or federal sponsored [place], including [employer], [place], or [employer].   You generally cannot be eligible for healthcare insurance through your employer.    How to apply: Eligibility screenings are held every Tuesday and Wednesday afternoon from 1:00 pm until 4:00 pm. You do not need an appointment for the interview!  [facility] [address], [place], [place] [phone]   [facility] Health Department  [phone]   [facility] Health Department   [phone]   [facility] Health Department  [phone]    Behavioral Health Resources in the Community: Intensive Outpatient Programs Organization         Address  Phone  Notes  [facility] [address] [place], [place] [phone]   [facility] Outpatient [address], [place], [place] [phone]   [facility] [address] [place], [place]  [phone]   [facility] [address]  [place], [place] [phone] or [phone]   Substance Abuse Resources Organization         Address  Phone  Notes  [facility]  [phone]   [facility]  7087331736   [facility]  [phone]   Floydene Flock  [phone]   Residential & Outpatient Substance Abuse Program  [phone]   Psychological Services Organization         Address  Phone  Notes  [facility]  [phone]   [facility] Services  [phone]   [facility] Mental Health [address], [place] [phone] or [phone]    Mobile Crisis Teams Organization         Address  Phone  Notes  [facility], Mobile Crisis Care Unit  [phone]   [facility]  [address] [place], [place] [phone]   Doristine Locks [address] [place] [phone]    Self-Help/Support Groups Organization         Address  Phone             Notes  [facility]oc[facility] of [place] - variety of support groups  [phone]- I7437963 Call for more information  Narcotics Anonymous (NA), [facility] [address], [employer] [place]  2 meetings at this location   Statistician         Address  Phone  Notes  [facility] [address],    [place]  [phone]   [facility]  [address], [place] [address], [place], [place] [phone]   [facility] [address]  [place], [place] [phone] Admissions: 8am-3pm M-F  [facility] [address],    [place], [place] [phone]   [facility] [address] [place], [place], [place] [phone]   [facility] [address],  [place], [place] [phone]   [facility] - Intensive Outpatient [address], [place], [place] [phone]   [facility] ([facility] [address]  [place], [place] [phone] or [phone]   [facility] ([facility]) [address], [place], [place] [phone] Accepts Medicaid  [facility] [address]  [place] [phone] Substance Abuse/Addiction Treatment   [facility] Organization         Address  Phone  Notes  [facility]  [phone]   Angie Fava, PhD [address] [place], [place]   [phone] or [phone]   [facility]   [address] [place], [place] [phone]   [facility] [address] [place], [place] [phone] Insurance/Medicaid/sponsorship through [facility] [address]                                    [place], [place] [phone] Therapy/tele-psych/case  [facility] [address] [place], [place] [phone]    Dr. Lolly Mustache  [phone]   [facility]  [employer] Comprehensive Outpatient [place] Dept. 1) [address] [place] 2) [address] [place] 3)  [address], [place] [phone]  [phone]   [facility] Child Abuse Hotline (613) 246-1640 or [phone] (After Hours)

## 2013-11-28 NOTE — ED Provider Notes (Signed)
Medical screening examination/treatment/procedure(s) were performed by non-physician practitioner and as supervising physician I was immediately available for consultation/collaboration.  EKG Interpretation   None         Ladarious Kresse T Keenen Roessner, MD 11/28/13 1509 

## 2015-01-16 ENCOUNTER — Other Ambulatory Visit: Payer: Self-pay | Admitting: Family Medicine

## 2015-01-16 DIAGNOSIS — E010 Iodine-deficiency related diffuse (endemic) goiter: Secondary | ICD-10-CM

## 2017-11-04 ENCOUNTER — Ambulatory Visit: Payer: Self-pay | Admitting: Family Medicine

## 2017-11-10 ENCOUNTER — Ambulatory Visit: Payer: Self-pay | Admitting: Family Medicine

## 2017-11-10 NOTE — Progress Notes (Deleted)
  No chief complaint on file.   HPI  4 review of systems  Past Medical History:  Diagnosis Date  . Anemia   . Hypertension    maintained on exforge    Current Outpatient Medications  Medication Sig Dispense Refill  . amLODipine-valsartan (EXFORGE) 10-160 MG per tablet Take 1 tablet by mouth every morning.     Marland Kitchen ibuprofen (ADVIL,MOTRIN) 800 MG tablet Take 1 tablet (800 mg total) by mouth 3 (three) times daily. 21 tablet 0  . methocarbamol (ROBAXIN) 500 MG tablet Take 1 tablet (500 mg total) by mouth 2 (two) times daily. 20 tablet 0   No current facility-administered medications for this visit.     Allergies: No Known Allergies  Past Surgical History:  Procedure Laterality Date  . ABDOMINAL HYSTERECTOMY    . CESAREAN SECTION    . DILATION AND CURETTAGE OF UTERUS  07/2008   ablation  . ganglion cyst removal     wrist  . LAPAROSCOPIC SUPRACERVICAL HYSTERECTOMY  10/08/2011   Procedure: LAPAROSCOPIC SUPRACERVICAL HYSTERECTOMY;  Surgeon: Clarene Duke, MD;  Location: [facility];  Service: Gynecology;  Laterality: N/A;    Social History   Socioeconomic History  . Marital status: Married    Spouse name: Not on file  . Number of children: Not on file  . Years of education: Not on file  . Highest education level: Not on file  Social Needs  . Financial resource strain: Not on file  . Food insecurity - worry: Not on file  . Food insecurity - inability: Not on file  . Transportation needs - medical: Not on file  . Transportation needs - non-medical: Not on file  Occupational History  . Not on file  Tobacco Use  . Smoking status: Never Smoker  Substance and Sexual Activity  . Alcohol use: No  . Drug use: No  . Sexual activity: Not on file  Other Topics Concern  . Not on file  Social History Narrative  . Not on file    Family History  Problem Relation Age of Onset  . Hypertension Mother   . Cancer Mother   . Hypertension Father      ROS Review of Systems See  HPI Constitution: No fevers or chills No malaise No diaphoresis Skin: No rash or itching Eyes: no blurry vision, no double vision GU: no dysuria or hematuria Neuro: no dizziness or headaches * all others reviewed and negative   Objective: There were no vitals filed for this visit.  Physical Exam  Assessment and Plan There are no diagnoses linked to this encounter.   Delores P [employer]

## 2017-11-18 NOTE — Progress Notes (Deleted)
  No chief complaint on file.   HPI  4 review of systems  Past Medical History:  Diagnosis Date  . Anemia   . Hypertension    maintained on exforge    Current Outpatient Medications  Medication Sig Dispense Refill  . amLODipine-valsartan (EXFORGE) 10-160 MG per tablet Take 1 tablet by mouth every morning.     Marland Kitchen ibuprofen (ADVIL,MOTRIN) 800 MG tablet Take 1 tablet (800 mg total) by mouth 3 (three) times daily. 21 tablet 0  . methocarbamol (ROBAXIN) 500 MG tablet Take 1 tablet (500 mg total) by mouth 2 (two) times daily. 20 tablet 0   No current facility-administered medications for this visit.     Allergies: No Known Allergies  Past Surgical History:  Procedure Laterality Date  . ABDOMINAL HYSTERECTOMY    . CESAREAN SECTION    . DILATION AND CURETTAGE OF UTERUS  07/2008   ablation  . ganglion cyst removal     wrist  . LAPAROSCOPIC SUPRACERVICAL HYSTERECTOMY  10/08/2011   Procedure: LAPAROSCOPIC SUPRACERVICAL HYSTERECTOMY;  Surgeon: Clarene Duke, MD;  Location: [facility];  Service: Gynecology;  Laterality: N/A;    Social History   Socioeconomic History  . Marital status: Married    Spouse name: Not on file  . Number of children: Not on file  . Years of education: Not on file  . Highest education level: Not on file  Social Needs  . Financial resource strain: Not on file  . Food insecurity - worry: Not on file  . Food insecurity - inability: Not on file  . Transportation needs - medical: Not on file  . Transportation needs - non-medical: Not on file  Occupational History  . Not on file  Tobacco Use  . Smoking status: Never Smoker  Substance and Sexual Activity  . Alcohol use: No  . Drug use: No  . Sexual activity: Not on file  Other Topics Concern  . Not on file  Social History Narrative  . Not on file    Family History  Problem Relation Age of Onset  . Hypertension Mother   . Cancer Mother   . Hypertension Father      ROS Review of Systems See  HPI Constitution: No fevers or chills No malaise No diaphoresis Skin: No rash or itching Eyes: no blurry vision, no double vision GU: no dysuria or hematuria Neuro: no dizziness or headaches * all others reviewed and negative   Objective: There were no vitals filed for this visit.  Physical Exam  Assessment and Plan There are no diagnoses linked to this encounter.   Delores P [employer]

## 2017-11-22 ENCOUNTER — Ambulatory Visit: Payer: Self-pay | Admitting: Family Medicine

## 2018-02-09 ENCOUNTER — Encounter: Payer: Self-pay | Admitting: Family Medicine

## 2018-02-09 ENCOUNTER — Ambulatory Visit: Payer: BC Managed Care – PPO | Admitting: Family Medicine

## 2018-02-09 ENCOUNTER — Other Ambulatory Visit: Payer: Self-pay

## 2018-02-09 VITALS — BP 138/86 | HR 78 | Temp 98.4°F | Resp 17 | Ht 64.0 in | Wt 197.6 lb

## 2018-02-09 DIAGNOSIS — I10 Essential (primary) hypertension: Secondary | ICD-10-CM | POA: Diagnosis not present

## 2018-02-09 DIAGNOSIS — E049 Nontoxic goiter, unspecified: Secondary | ICD-10-CM | POA: Diagnosis not present

## 2018-02-09 DIAGNOSIS — E6609 Other obesity due to excess calories: Secondary | ICD-10-CM | POA: Diagnosis not present

## 2018-02-09 DIAGNOSIS — Z13220 Encounter for screening for lipoid disorders: Secondary | ICD-10-CM | POA: Diagnosis not present

## 2018-02-09 DIAGNOSIS — Z6833 Body mass index (BMI) 33.0-33.9, adult: Secondary | ICD-10-CM | POA: Diagnosis not present

## 2018-02-09 NOTE — Progress Notes (Signed)
Chief Complaint  Patient presents with  . New Patient (Initial Visit)    establish care.  Wants labs to check  kidney fxn and other organs to make sure bp med is not affecting organs    HPI  Hypertension Pt reports that she is taking amlodipine-valsartan 10-160mg  but does ont remember if it has hctz in it or not She thinks the hctz must be in it due to the increased urination She does not exercise regularly She walks in the halls in and out of classrooms but does not exercise out of work She has a strong family history of hypertension  She states that she have gone months and exercised daily in the neighborhood   Obesity Patient reports that she is planning on returning to walking after dinner She will walk with her husband and sister She denies any chest pains, palpitations and shortness of breath Body mass index is 33.92 kg/m. Wt Readings from Last 3 Encounters:  02/09/18 197 lb 9.6 oz (89.6 kg)  11/23/13 190 lb (86.2 kg)  09/27/13 187 lb 6.4 oz (85 kg)    History of anemia Previously had anemia Improved since hysterectomy in 2012 She is taking a b-complex vitamin, fish oil, omega-3, an over the counter iron She reports that she has had this regimen to help her iron for year     Past Medical History:  Diagnosis Date  . Anemia   . Hypertension    maintained on exforge    Current Outpatient Medications  Medication Sig Dispense Refill  . amLODipine-valsartan (EXFORGE) 10-160 MG per tablet Take 1 tablet by mouth every morning.     Marland Kitchen ibuprofen (ADVIL,MOTRIN) 800 MG tablet Take 1 tablet (800 mg total) by mouth 3 (three) times daily. 21 tablet 0   No current facility-administered medications for this visit.     Allergies: No Known Allergies  Past Surgical History:  Procedure Laterality Date  . ABDOMINAL HYSTERECTOMY    . CESAREAN SECTION    . DILATION AND CURETTAGE OF UTERUS  07/2008   ablation  . ganglion cyst removal     wrist  . LAPAROSCOPIC SUPRACERVICAL  HYSTERECTOMY  10/08/2011   Procedure: LAPAROSCOPIC SUPRACERVICAL HYSTERECTOMY;  Surgeon: Clarene Duke, MD;  Location: [facility];  Service: Gynecology;  Laterality: N/A;    Social History   Socioeconomic History  . Marital status: Married    Spouse name: Not on file  . Number of children: Not on file  . Years of education: Not on file  . Highest education level: Not on file  Occupational History  . Not on file  Social Needs  . Financial resource strain: Not on file  . Food insecurity:    Worry: Not on file    Inability: Not on file  . Transportation needs:    Medical: Not on file    Non-medical: Not on file  Tobacco Use  . Smoking status: Never Smoker  . Smokeless tobacco: Never Used  Substance and Sexual Activity  . Alcohol use: No  . Drug use: No  . Sexual activity: Not on file  Lifestyle  . Physical activity:    Days per week: Not on file    Minutes per session: Not on file  . Stress: Not on file  Relationships  . Social connections:    Talks on phone: Not on file    Gets together: Not on file    Attends religious service: Not on file    Active member of club or  organization: Not on file    Attends meetings of clubs or organizations: Not on file    Relationship status: Not on file  Other Topics Concern  . Not on file  Social History Narrative  . Not on file    Family History  Problem Relation Age of Onset  . Hypertension Mother   . Cancer Mother   . Hypertension Father   . Hypertension Sister   . Hypertension Brother   . Hypertension Sister      ROS Review of Systems See HPI Constitution: No fevers or chills No malaise No diaphoresis Skin: No rash or itching Eyes: no blurry vision, no double vision GU: no dysuria or hematuria Neuro: no dizziness or headaches * all others reviewed and negative   Objective: Vitals:   02/09/18 1138  BP: 138/86  Pulse: 78  Resp: 17  Temp: 98.4 F (36.9 C)  TempSrc: Oral  SpO2: 98%  Weight: 197 lb 9.6 oz  (89.6 kg)  Height: 5\' 4"  (1.626 m)    Physical Exam   Assessment and Plan Tammy Brock was seen today for new patient (initial visit).  Diagnoses and all orders for this visit:  Essential hypertension- bp in good range today Will check for hormonal modifiable risk factors -     Comprehensive metabolic panel -     Microalbumin, urine -     VITAMIN D 25 Hydroxy (Vit-D Deficiency, Fractures) -     Lipid panel -     CBC  Screening, lipid -     Lipid panel  Class 1 obesity due to excess calories with serious comorbidity and body mass index (BMI) of 33.0 to 33.9 in adult- discussed weight loss strategies  Enlarged thyroid gland- discussed thyroid ultrasound  -     US THYROID; Future     Ellice Boultinghouse A Damario Gillie

## 2018-02-09 NOTE — Patient Instructions (Addendum)
   IF you received an x-ray today, you will receive an invoice from [facility]. Please contact [facility] at [phone] with questions or concerns regarding your invoice.   IF you received labwork today, you will receive an invoice from [facility]. Please contact [facility] at [phone] with questions or concerns regarding your invoice.   Our billing staff will not be able to assist you with questions regarding bills from these companies.  You will be contacted with the lab results as soon as they are available. The fastest way to get your results is to activate your My Chart account. Instructions are located on the last page of this paperwork. If you have not heard from us regarding the results in 2 weeks, please contact this office.     Exercising to Lose Weight Exercising can help you to lose weight. In order to lose weight through exercise, you need to do vigorous-intensity exercise. You can tell that you are exercising with vigorous intensity if you are breathing very hard and fast and cannot hold a conversation while exercising. Moderate-intensity exercise helps to maintain your current weight. You can tell that you are exercising at a moderate level if you have a higher heart rate and faster breathing, but you are still able to hold a conversation. How often should I exercise? Choose an activity that you enjoy and set realistic goals. Your health care provider can help you to make an activity plan that works for you. Exercise regularly as directed by your health care provider. This may include:  Doing resistance training twice each week, such as: ? Push-ups. ? Sit-ups. ? Lifting weights. ? Using resistance bands.  Doing a given intensity of exercise for a given amount of time. Choose from these options: ? 150 minutes of moderate-intensity exercise every week. ? 75 minutes of vigorous-intensity exercise every week. ? A mix of moderate-intensity and  vigorous-intensity exercise every week.  Children, pregnant women, people who are out of shape, people who are overweight, and older adults may need to consult a health care provider for individual recommendations. If you have any sort of medical condition, be sure to consult your health care provider before starting a new exercise program. What are some activities that can help me to lose weight?  Walking at a rate of at least 4.5 miles an hour.  Jogging or running at a rate of 5 miles per hour.  Biking at a rate of at least 10 miles per hour.  Lap swimming.  Roller-skating or in-line skating.  Cross-country skiing.  Vigorous competitive sports, such as football, basketball, and soccer.  Jumping rope.  Aerobic dancing. How can I be more active in my day-to-day activities?  Use the stairs instead of the elevator.  Take a walk during your lunch break.  If you drive, park your car farther away from work or school.  If you take public transportation, get off one stop early and walk the rest of the way.  Make all of your phone calls while standing up and walking around.  Get up, stretch, and walk around every 30 minutes throughout the day. What guidelines should I follow while exercising?  Do not exercise so much that you hurt yourself, feel dizzy, or get very short of breath.  Consult your health care provider prior to starting a new exercise program.  Wear comfortable clothes and shoes with good support.  Drink plenty of water while you exercise to prevent dehydration or heat stroke. Body water is   lost during exercise and must be replaced.  Work out until you breathe faster and your heart beats faster. This information is not intended to replace advice given to you by your health care provider. Make sure you discuss any questions you have with your health care provider. Document Released: 11/07/2010 Document Revised: 03/12/2016 Document Reviewed: 03/08/2014 Elsevier  Interactive Patient Education  2018 [employer]vier Inc.  

## 2018-02-10 ENCOUNTER — Telehealth: Payer: Self-pay

## 2018-02-10 ENCOUNTER — Encounter: Payer: Self-pay | Admitting: Family Medicine

## 2018-02-10 LAB — COMPREHENSIVE METABOLIC PANEL
ALBUMIN: 4 g/dL (ref 3.5–5.5)
ALT: 15 IU/L (ref 0–32)
AST: 17 IU/L (ref 0–40)
Albumin/Globulin Ratio: 1.3 (ref 1.2–2.2)
Alkaline Phosphatase: 92 IU/L (ref 39–117)
BUN / CREAT RATIO: 17 (ref 9–23)
BUN: 11 mg/dL (ref 6–24)
Bilirubin Total: 0.2 mg/dL (ref 0.0–1.2)
CALCIUM: 9.3 mg/dL (ref 8.7–10.2)
CO2: 26 mmol/L (ref 20–29)
CREATININE: 0.65 mg/dL (ref 0.57–1.00)
Chloride: 101 mmol/L (ref 96–106)
GFR calc Af Amer: 121 mL/min/{1.73_m2} (ref >59)
GFR, EST NON AFRICAN AMERICAN: 105 mL/min/{1.73_m2} (ref >59)
GLOBULIN, TOTAL: 3 g/dL (ref 1.5–4.5)
Glucose: 84 mg/dL (ref 65–99)
Potassium: 4 mmol/L (ref 3.5–5.2)
SODIUM: 140 mmol/L (ref 134–144)
Total Protein: 7 g/dL (ref 6.0–8.5)

## 2018-02-10 LAB — CBC
Hematocrit: 37.3 % (ref 34.0–46.6)
Hemoglobin: 11.9 g/dL (ref 11.1–15.9)
MCH: 29.8 pg (ref 26.6–33.0)
MCHC: 31.9 g/dL (ref 31.5–35.7)
MCV: 94 fL (ref 79–97)
PLATELETS: 366 10*3/uL (ref 150–379)
RBC: 3.99 x10E6/uL (ref 3.77–5.28)
RDW: 13.3 % (ref 12.3–15.4)
WBC: 6.2 10*3/uL (ref 3.4–10.8)

## 2018-02-10 LAB — MICROALBUMIN, URINE: Microalbumin, Urine: 3.7 ug/mL

## 2018-02-10 LAB — LIPID PANEL
Chol/HDL Ratio: 3 ratio (ref 0.0–4.4)
Cholesterol, Total: 164 mg/dL (ref 100–199)
HDL: 54 mg/dL (ref >39)
LDL Calculated: 91 mg/dL (ref 0–99)
Triglycerides: 95 mg/dL (ref 0–149)
VLDL CHOLESTEROL CAL: 19 mg/dL (ref 5–40)

## 2018-02-10 LAB — VITAMIN D 25 HYDROXY (VIT D DEFICIENCY, FRACTURES): VIT D 25 HYDROXY: 15.5 ng/mL — AB (ref 30.0–100.0)

## 2018-02-10 NOTE — Telephone Encounter (Signed)
Lab results sent via mail to pt home address. Dgaddy, CMA 

## 2018-02-22 ENCOUNTER — Telehealth: Payer: Self-pay | Admitting: Family Medicine

## 2018-02-22 ENCOUNTER — Encounter: Payer: Self-pay | Admitting: Family Medicine

## 2018-02-22 NOTE — Telephone Encounter (Signed)
Pt given results per notes of Dr. Nolon Rod on 02/10/18, patient verbalized understanding. Unable to document in result note due to result note not being routed to [facility]. Patient says her BP medication was updated at her last visit. Her BP medication is Amlodipine-Valsartan-HCTZ 10-160-12.5 mg 1 tablet daily. She asks for a refill on this medication.

## 2018-02-23 ENCOUNTER — Other Ambulatory Visit: Payer: Self-pay | Admitting: Family Medicine

## 2018-02-23 DIAGNOSIS — I10 Essential (primary) hypertension: Secondary | ICD-10-CM

## 2018-02-23 MED ORDER — AMLODIPINE-VALSARTAN-HCTZ 10-160-12.5 MG PO TABS: 1.0000 | ORAL_TABLET | Freq: Every day | ORAL | 1 refills | Status: DC

## 2018-02-23 NOTE — Progress Notes (Signed)
Verified medication and sent in refill

## 2018-02-23 NOTE — Telephone Encounter (Signed)
Please advise med refill for pt.

## 2018-02-28 ENCOUNTER — Encounter: Payer: Self-pay | Admitting: Family Medicine

## 2018-02-28 ENCOUNTER — Ambulatory Visit: Payer: BC Managed Care – PPO | Admitting: Family Medicine

## 2018-02-28 ENCOUNTER — Other Ambulatory Visit: Payer: Self-pay

## 2018-02-28 VITALS — BP 132/84 | HR 64 | Temp 98.6°F | Ht 64.0 in | Wt 196.8 lb

## 2018-02-28 DIAGNOSIS — H6123 Impacted cerumen, bilateral: Secondary | ICD-10-CM | POA: Diagnosis not present

## 2018-02-28 NOTE — Patient Instructions (Signed)
     IF you received an x-ray today, you will receive an invoice from [facility]. Please contact [facility] at [phone] with questions or concerns regarding your invoice.   IF you received labwork today, you will receive an invoice from [facility]. Please contact [facility] at [phone] with questions or concerns regarding your invoice.   Our billing staff will not be able to assist you with questions regarding bills from these companies.  You will be contacted with the lab results as soon as they are available. The fastest way to get your results is to activate your My Chart account. Instructions are located on the last page of this paperwork. If you have not heard from us regarding the results in 2 weeks, please contact this office.     

## 2018-02-28 NOTE — Progress Notes (Signed)
Chief Complaint  Patient presents with  . Ear Fullness    HAVING PAIN IN THE LFT EAR    HPI  Pt with bilateral ear fullness She has a history of frequently blocked ears from wax She states that she is noticing decreased hearing She states that she feels pain in the left ear No fevers or chills No dizziness    Past Medical History:  Diagnosis Date  . Anemia   . Hypertension    maintained on exforge    Current Outpatient Medications  Medication Sig Dispense Refill  . amLODIPine-Valsartan-HCTZ 10-160-12.5 MG TABS Take 1 tablet by mouth daily. 90 tablet 1  . ibuprofen (ADVIL,MOTRIN) 800 MG tablet Take 1 tablet (800 mg total) by mouth 3 (three) times daily. 21 tablet 0   No current facility-administered medications for this visit.     Allergies: No Known Allergies  Past Surgical History:  Procedure Laterality Date  . ABDOMINAL HYSTERECTOMY    . CESAREAN SECTION    . DILATION AND CURETTAGE OF UTERUS  07/2008   ablation  . ganglion cyst removal     wrist  . LAPAROSCOPIC SUPRACERVICAL HYSTERECTOMY  10/08/2011   Procedure: LAPAROSCOPIC SUPRACERVICAL HYSTERECTOMY;  Surgeon: Clarene Duke, MD;  Location: [facility];  Service: Gynecology;  Laterality: N/A;    Social History   Socioeconomic History  . Marital status: Married    Spouse name: Not on file  . Number of children: Not on file  . Years of education: Not on file  . Highest education level: Not on file  Occupational History  . Not on file  Social Needs  . Financial resource strain: Not on file  . Food insecurity:    Worry: Not on file    Inability: Not on file  . Transportation needs:    Medical: Not on file    Non-medical: Not on file  Tobacco Use  . Smoking status: Never Smoker  . Smokeless tobacco: Never Used  Substance and Sexual Activity  . Alcohol use: No  . Drug use: No  . Sexual activity: Not on file  Lifestyle  . Physical activity:    Days per week: Not on file    Minutes per session: Not  on file  . Stress: Not on file  Relationships  . Social connections:    Talks on phone: Not on file    Gets together: Not on file    Attends religious service: Not on file    Active member of club or organization: Not on file    Attends meetings of clubs or organizations: Not on file    Relationship status: Not on file  Other Topics Concern  . Not on file  Social History Narrative  . Not on file    Family History  Problem Relation Age of Onset  . Hypertension Mother   . Cancer Mother   . Hypertension Father   . Hypertension Sister   . Hypertension Brother   . Hypertension Sister      ROS Review of Systems See HPI Constitution: No fevers or chills No malaise No diaphoresis Skin: No rash or itching Eyes: no blurry vision, no double vision GU: no dysuria or hematuria Neuro: no dizziness or headaches all others reviewed and negative   Objective: Vitals:   02/28/18 1003  BP: 132/84  Pulse: 64  Temp: 98.6 F (37 C)  TempSrc: Oral  SpO2: 98%  Weight: 196 lb 12.8 oz (89.3 kg)  Height: 5\' 4"  (1.626 m)  Physical Exam  Constitutional: She is oriented to person, place, and time. She appears well-developed and well-nourished.  HENT:  Head: Normocephalic and atraumatic.  Bilateral impacted cerumen  Eyes: Conjunctivae and EOM are normal.  Pulmonary/Chest: Effort normal.  Neurological: She is alert and oriented to person, place, and time.  Psychiatric: She has a normal mood and affect. Her behavior is normal. Judgment and thought content normal.    Assessment and Plan Tammy Brock was seen today for ear fullness.  Diagnoses and all orders for this visit:  Bilateral impacted cerumen -     Ear wax removal  resolved with ear lavage   Tammy Brock

## 2018-03-16 ENCOUNTER — Telehealth: Payer: Self-pay

## 2018-03-16 NOTE — Telephone Encounter (Signed)
Copied from [place] [id number]. Topic: Referral - Request >> Mar 16, 2018 10:03 AM Vernona Rieger wrote: Reason for CRM: Patient is looking for a martial counselor. She is unsure of who she would want to see but wanted to ask Dr Nolon Rod about it. Call back is [phone]  Sent to Dr. Nolon Rod for referral

## 2018-03-29 NOTE — Telephone Encounter (Signed)
Please let her know that the best way is to contact her insurance or check on [employer] and find a local couple's therapist that way.  I will work on creating a list of resources but for now I do not know anyone in this area personally.

## 2018-03-29 NOTE — Telephone Encounter (Signed)
Called patient and advised

## 2018-08-15 ENCOUNTER — Ambulatory Visit: Payer: BC Managed Care – PPO | Admitting: Family Medicine

## 2018-09-02 ENCOUNTER — Other Ambulatory Visit: Payer: Self-pay | Admitting: Family Medicine

## 2018-09-02 DIAGNOSIS — I10 Essential (primary) hypertension: Secondary | ICD-10-CM

## 2018-09-02 NOTE — Telephone Encounter (Signed)
Requested Prescriptions  Pending Prescriptions Disp Refills  . amLODIPine-Valsartan-HCTZ 10-160-12.5 MG TABS [Pharmacy Med Name: AMLOD-VALSA-HCTZ 10-160-12.5MG ] 90 tablet 0    Sig: TAKE 1 TABLET BY MOUTH EVERY DAY     Cardiovascular: CCB + ARB + Diuretic Combos Failed - 09/02/2018 11:03 AM      Failed - K in normal range and within 180 days    Potassium  Date Value Ref Range Status  02/09/2018 4.0 3.5 - 5.2 mmol/L Final         Failed - Na in normal range and within 180 days    Sodium  Date Value Ref Range Status  02/09/2018 140 134 - 144 mmol/L Final         Failed - Cr in normal range and within 180 days    Creatinine, Ser  Date Value Ref Range Status  02/09/2018 0.65 0.57 - 1.00 mg/dL Final         Failed - Ca in normal range and within 180 days    Calcium  Date Value Ref Range Status  02/09/2018 9.3 8.7 - 10.2 mg/dL Final         Failed - Valid encounter within last 6 months    Recent Outpatient Visits          6 months ago Bilateral impacted cerumen   [facility], Arlie Solomons, MD   6 months ago Essential hypertension   [facility] at [facility], Arlie Solomons, MD      Future Appointments            In 1 month Forrest Moron, MD [facility] [place] - Patient is not pregnant      Passed - Last BP in normal range    BP Readings from Last 1 Encounters:  02/28/18 132/84         Passed - Last Heart Rate in normal range    Pulse Readings from Last 1 Encounters:  02/28/18 64

## 2018-10-14 ENCOUNTER — Encounter: Payer: Self-pay | Admitting: Family Medicine

## 2018-10-14 ENCOUNTER — Ambulatory Visit (INDEPENDENT_AMBULATORY_CARE_PROVIDER_SITE_OTHER): Payer: BC Managed Care – PPO | Admitting: Family Medicine

## 2018-10-14 ENCOUNTER — Telehealth: Payer: Self-pay

## 2018-10-14 ENCOUNTER — Other Ambulatory Visit: Payer: Self-pay

## 2018-10-14 VITALS — BP 132/77 | HR 74 | Temp 99.1°F | Resp 16 | Ht 64.5 in | Wt 179.4 lb

## 2018-10-14 DIAGNOSIS — Z0000 Encounter for general adult medical examination without abnormal findings: Secondary | ICD-10-CM

## 2018-10-14 DIAGNOSIS — E559 Vitamin D deficiency, unspecified: Secondary | ICD-10-CM

## 2018-10-14 DIAGNOSIS — I10 Essential (primary) hypertension: Secondary | ICD-10-CM

## 2018-10-14 MED ORDER — AMLODIPINE-VALSARTAN-HCTZ 10-160-12.5 MG PO TABS: 1.0000 | ORAL_TABLET | Freq: Every day | ORAL | 3 refills | Status: DC

## 2018-10-14 MED ORDER — VITAMIN D (ERGOCALCIFEROL) 1.25 MG (50000 UNIT) PO CAPS: 50000.0000 [IU] | ORAL_CAPSULE | ORAL | 0 refills | Status: DC

## 2018-10-14 NOTE — Telephone Encounter (Signed)
Rescheduled appt for imaging of thyroid for pt. [facility], 12/31 @ 415.

## 2018-10-14 NOTE — Progress Notes (Signed)
Chief Complaint  Patient presents with  . Annual Exam  . Medication Refill    amlodipine-Valsartan-hctz    Subjective:  Tammy Brock is a 47 y.o. female here for a health maintenance visit.  Patient is established pt  Patient Active Problem List   Diagnosis Date Noted  . Fibroid uterus 10/08/2011    Past Medical History:  Diagnosis Date  . Anemia   . Hypertension    maintained on exforge    Past Surgical History:  Procedure Laterality Date  . ABDOMINAL HYSTERECTOMY    . CESAREAN SECTION    . DILATION AND CURETTAGE OF UTERUS  07/2008   ablation  . ganglion cyst removal     wrist  . LAPAROSCOPIC SUPRACERVICAL HYSTERECTOMY  10/08/2011   Procedure: LAPAROSCOPIC SUPRACERVICAL HYSTERECTOMY;  Surgeon: Clarene Duke, MD;  Location: [facility];  Service: Gynecology;  Laterality: N/A;     Outpatient Medications Prior to Visit  Medication Sig Dispense Refill  . amLODIPine-Valsartan-HCTZ 10-160-12.5 MG TABS TAKE 1 TABLET BY MOUTH EVERY DAY 90 tablet 0  . ibuprofen (ADVIL,MOTRIN) 800 MG tablet Take 1 tablet (800 mg total) by mouth 3 (three) times daily. 21 tablet 0   No facility-administered medications prior to visit.     No Known Allergies   Family History  Problem Relation Age of Onset  . Hypertension Mother   . Cancer Mother   . Hypertension Father   . Hypertension Sister   . Hypertension Brother   . Hypertension Sister      Health Habits: Dental Exam: up to date Eye Exam: up to date Exercise: 4 times/week on average Current exercise activities: walking/running Diet:   Social History   Socioeconomic History  . Marital status: Married    Spouse name: Not on file  . Number of children: Not on file  . Years of education: Not on file  . Highest education level: Not on file  Occupational History  . Not on file  Social Needs  . Financial resource strain: Not on file  . Food insecurity:    Worry: Not on file    Inability: Not on file  . Transportation  needs:    Medical: Not on file    Non-medical: Not on file  Tobacco Use  . Smoking status: Never Smoker  . Smokeless tobacco: Never Used  Substance and Sexual Activity  . Alcohol use: No  . Drug use: No  . Sexual activity: Not on file  Lifestyle  . Physical activity:    Days per week: Not on file    Minutes per session: Not on file  . Stress: Not on file  Relationships  . Social connections:    Talks on phone: Not on file    Gets together: Not on file    Attends religious service: Not on file    Active member of club or organization: Not on file    Attends meetings of clubs or organizations: Not on file    Relationship status: Not on file  . Intimate partner violence:    Fear of current or ex partner: Not on file    Emotionally abused: Not on file    Physically abused: Not on file    Forced sexual activity: Not on file  Other Topics Concern  . Not on file  Social History Narrative  . Not on file   Social History   Substance and Sexual Activity  Alcohol Use No   Social History   Tobacco Use  Smoking Status Never Smoker  Smokeless Tobacco Never Used   Social History   Substance and Sexual Activity  Drug Use No    GYN: Sexual Health Menstrual status: regular menses LMP: Patient's last menstrual period was 09/21/2011. Last pap smear: see HM section History of abnormal pap smears:  Sexually active: with female partner Current contraception:   Health Maintenance: See under health Maintenance activity for review of completion dates as well.  There is no immunization history on file for this patient.    Depression Screen-PHQ2/9 Depression screen [facility] 2/9 10/14/2018 02/28/2018 02/09/2018  Decreased Interest 0 0 0  Down, Depressed, Hopeless 0 0 0  PHQ - 2 Score 0 0 0     Depression Severity and Treatment Recommendations:  0-4= None  5-9= Mild / Treatment: Support, educate to call if worse; return in one month  10-14= Moderate / Treatment: Support, watchful  waiting; Antidepressant or Psycotherapy  15-19= Moderately severe / Treatment: Antidepressant OR Psychotherapy  >= 20 = Major depression, severe / Antidepressant AND Psychotherapy    Review of Systems   Review of Systems  Constitutional: Negative for chills and fever.  Respiratory: Negative for cough and shortness of breath.   Cardiovascular: Negative for chest pain and palpitations.  Gastrointestinal: Negative for abdominal pain, nausea and vomiting.  Skin: Negative for itching and rash.  Neurological: Negative for dizziness, tingling and headaches.  Psychiatric/Behavioral: Negative for depression. The patient is not nervous/anxious.     See HPI for ROS as well.    Objective:   Vitals:   10/14/18 0811  BP: 132/77  Pulse: 74  Resp: 16  Temp: 99.1 F (37.3 C)  TempSrc: Oral  SpO2: 100%  Weight: 179 lb 6.4 oz (81.4 kg)  Height: 5' 4.5" (1.638 m)   Wt Readings from Last 3 Encounters:  10/14/18 179 lb 6.4 oz (81.4 kg)  02/28/18 196 lb 12.8 oz (89.3 kg)  02/09/18 197 lb 9.6 oz (89.6 kg)   BP Readings from Last 3 Encounters:  10/14/18 132/77  02/28/18 132/84  02/09/18 138/86    Body mass index is 30.32 kg/m.  Physical Exam Constitutional:      Appearance: Normal appearance.  HENT:     Head: Normocephalic and atraumatic.     Right Ear: Tympanic membrane and ear canal normal.     Left Ear: Tympanic membrane and ear canal normal.     Nose: Nose normal. No congestion.  Eyes:     Extraocular Movements: Extraocular movements intact.     Conjunctiva/sclera: Conjunctivae normal.  Neck:     Musculoskeletal: Normal range of motion and neck supple.  Cardiovascular:     Rate and Rhythm: Normal rate and regular rhythm.     Pulses: Normal pulses.     Heart sounds: Normal heart sounds.  Pulmonary:     Effort: Pulmonary effort is normal. No respiratory distress.     Breath sounds: Normal breath sounds. No wheezing or rales.  Abdominal:     General: Abdomen is flat.  Bowel sounds are normal. There is no distension.     Palpations: Abdomen is soft.     Tenderness: There is no abdominal tenderness.  Musculoskeletal: Normal range of motion.        General: No swelling.  Skin:    General: Skin is warm.     Capillary Refill: Capillary refill takes less than 2 seconds.  Neurological:     General: No focal deficit present.     Mental Status: She is  alert and oriented to person, place, and time.  Psychiatric:        Mood and Affect: Mood normal.        Behavior: Behavior normal.        Thought Content: Thought content normal.        Judgment: Judgment normal.        Assessment/Plan:   Patient was seen for a health maintenance exam.  Counseled the patient on health maintenance issues. Reviewed her health mainteance schedule and ordered appropriate tests (see orders.) Counseled on regular exercise and weight management. Recommend regular eye exams and dental cleaning.   The following issues were addressed today for health maintenance:   Maralyn was seen today for annual exam and medication refill.  Diagnoses and all orders for this visit:  Encounter for health maintenance examination in adult- Women's Health Maintenance Plan Advised monthly breast exam and annual mammogram Advised dental exam every six months Discussed stress management Discussed pap smear screening guidelines   -     Lipid panel  Essential hypertension- bp at goal, refilled meds   Vitamin D deficiency -     Vitamin D, 25-hydroxy -     Vitamin D, Ergocalciferol, (DRISDOL) 1.25 MG (50000 UT) CAPS capsule; Take 1 capsule (50,000 Units total) by mouth every 7 (seven) days.    No follow-ups on file.    Body mass index is 30.32 kg/m.:  Discussed the patient's BMI with patient. The BMI body mass index is 30.32 kg/m.     No future appointments.  Patient Instructions       If you have lab work done today you will be contacted with your lab results within the next 2  weeks.  If you have not heard from [place] then please contact us. The fastest way to get your results is to register for My Chart.   IF you received an x-ray today, you will receive an invoice from [facility]. Please contact [facility] at [phone] with questions or concerns regarding your invoice.   IF you received labwork today, you will receive an invoice from [facility]. Please contact [facility] at [phone] with questions or concerns regarding your invoice.   Our billing staff will not be able to assist you with questions regarding bills from these companies.  You will be contacted with the lab results as soon as they are available. The fastest way to get your results is to activate your My Chart account. Instructions are located on the last page of this paperwork. If you have not heard from [place] regarding the results in 2 weeks, please contact this office.    We recommend that you schedule a mammogram for breast cancer screening. Typically, you do not need a referral to do this. Please contact a local imaging center to schedule your mammogram.  [facility] - [phone]  *ask for the Radiology Department [facility] - [phone] or [phone]  [facility] - [phone] [place] [phone] [facility] - [phone]  *ask for the [facility] - [phone]  *ask for the Radiology Department [facility] - [phone]  *ask for the [place] - [phone]

## 2018-10-14 NOTE — Patient Instructions (Signed)
     If you have lab work done today you will be contacted with your lab results within the next 2 weeks.  If you have not heard from [place] then please contact us. The fastest way to get your results is to register for My Chart.   IF you received an x-ray today, you will receive an invoice from [facility]. Please contact [facility] at [phone] with questions or concerns regarding your invoice.   IF you received labwork today, you will receive an invoice from [facility]. Please contact [facility] at [phone] with questions or concerns regarding your invoice.   Our billing staff will not be able to assist you with questions regarding bills from these companies.  You will be contacted with the lab results as soon as they are available. The fastest way to get your results is to activate your My Chart account. Instructions are located on the last page of this paperwork. If you have not heard from [place] regarding the results in 2 weeks, please contact this office.    We recommend that you schedule a mammogram for breast cancer screening. Typically, you do not need a referral to do this. Please contact a local imaging center to schedule your mammogram.  [facility] - [phone]  *ask for the Radiology Department [facility] [place] ([place]) - [phone] or [phone]  [facility] - [phone] [place] [phone] [facility] - [phone]  *ask for the [facility] - [phone]  *ask for the Radiology Department [facility] - [phone]  *ask for the [place] - [phone]

## 2018-10-15 LAB — VITAMIN D 25 HYDROXY (VIT D DEFICIENCY, FRACTURES): Vit D, 25-Hydroxy: 15.2 ng/mL — ABNORMAL LOW (ref 30.0–100.0)

## 2018-10-15 LAB — LIPID PANEL
CHOL/HDL RATIO: 2.9 ratio (ref 0.0–4.4)
Cholesterol, Total: 178 mg/dL (ref 100–199)
HDL: 61 mg/dL (ref >39)
LDL CALC: 101 mg/dL — AB (ref 0–99)
Triglycerides: 80 mg/dL (ref 0–149)
VLDL Cholesterol Cal: 16 mg/dL (ref 5–40)

## 2018-10-17 ENCOUNTER — Telehealth: Payer: Self-pay | Admitting: *Deleted

## 2018-10-17 NOTE — Telephone Encounter (Signed)
Please let the patient know that her cholesterol levels are great. She should take the vitamin D 50,000 units once a week to improve her vitamin D and return so we can recheck her levels.

## 2018-10-18 ENCOUNTER — Ambulatory Visit
Admission: RE | Admit: 2018-10-18 | Discharge: 2018-10-18 | Disposition: A | Discharge status: Home / Self Care | Payer: BC Managed Care – PPO | Source: Ambulatory Visit | Attending: Family Medicine | Admitting: Family Medicine

## 2018-10-18 DIAGNOSIS — E049 Nontoxic goiter, unspecified: Secondary | ICD-10-CM

## 2018-10-22 ENCOUNTER — Encounter: Payer: Self-pay | Admitting: Radiology

## 2018-12-31 ENCOUNTER — Other Ambulatory Visit: Payer: Self-pay | Admitting: Family Medicine

## 2018-12-31 DIAGNOSIS — E559 Vitamin D deficiency, unspecified: Secondary | ICD-10-CM

## 2019-01-02 NOTE — Telephone Encounter (Signed)
Requested medication (s) are due for refill today: yes  Requested medication (s) are on the active medication list: yes  Last refill:  10/14/2018 for 12 caps  Future visit scheduled: yes  Notes to clinic:  Vitamin D supplementation failed, not delegated.  Requested Prescriptions  Pending Prescriptions Disp Refills   Vitamin D, Ergocalciferol, (DRISDOL) 1.25 MG (50000 UT) CAPS capsule [Pharmacy Med Name: VITAMIN D2 1.25MG (50,000 UNIT)] 12 capsule 0    Sig: TAKE 1 CAPSULE BY MOUTH EVERY 7 DAYS     Endocrinology:  Vitamins - Vitamin D Supplementation Failed - 12/31/2018 10:06 AM      Failed - 50,000 IU strengths are not delegated      Failed - Phosphate in normal range and within 360 days    No results found for: PHOS       Failed - Vitamin D in normal range and within 360 days    Vit D, 25-Hydroxy  Date Value Ref Range Status  10/14/2018 15.2 (L) 30.0 - 100.0 ng/mL Final    Comment:    Vitamin D deficiency has been defined by the Institute of Medicine and an Endocrine Society practice guideline as a level of serum 25-OH vitamin D less than 20 ng/mL (1,2). The Endocrine Society went on to further define vitamin D insufficiency as a level between 21 and 29 ng/mL (2). 1. IOM (Institute of Medicine). 2010. Dietary reference    intakes for calcium and D. [place]: The    [employer]. 2. Holick MF, [place], Bischoff-Ferrari HA, et al.    Evaluation, treatment, and prevention of vitamin D    deficiency: an Endocrine Society clinical practice    guideline. JCEM. 2011 Jul; 96(7):1911-30.          Passed - Ca in normal range and within 360 days    Calcium  Date Value Ref Range Status  02/09/2018 9.3 8.7 - 10.2 mg/dL Final         Passed - Valid encounter within last 12 months    Recent Outpatient Visits          2 months ago Encounter for health maintenance examination in adult   [facility] [place], MD   10 months ago Bilateral  impacted cerumen   [facility], Arlie Solomons, MD   10 months ago Essential hypertension   [facility] [place], MD      Future Appointments            In 3 months Forrest Moron, MD [facility] [place], [facility]

## 2019-04-16 NOTE — Progress Notes (Signed)
Established Patient Office Visit  Subjective:  Patient ID: Tammy Brock, female    DOB: 06-24-1971  Age: 48 y.o. MRN: [id number]  CC:  Chief Complaint  Patient presents with  . Follow up    follow up from last CPE. Repeat labs; states she is doing okay has no issues at this time    HPI [name] presents for  Hypertension follow up   Hypertension: Patient here for follow-up of elevated blood pressure. She is not exercising and is adherent to low salt diet.  Blood pressure is well controlled at home. Cardiac symptoms none. Patient denies chest pain, chest pressure/discomfort, claudication, dyspnea and exertional chest pressure/discomfort.  Cardiovascular risk factors: hypertension. Use of agents associated with hypertension: none. History of target organ damage: none. BP Readings from Last 3 Encounters:  04/17/19 110/78  10/14/18 132/77  02/28/18 132/84   Vitamin D Deficiency She is taking the 50,000 units of vitamin D  She reports that she has some fatigue but it seems better once she gets out and exercises She denies any bone pain Component     Latest Ref Rng & Units 02/09/2018 10/14/2018  Vitamin D, 25-Hydroxy     30.0 - 100.0 ng/mL 15.5 (L) 15.2 (L)    Thyroid nodule She had [place] of her thyroid gland which was benign Denies cold and heat intolerance She has not been exercising No diarrhea or constipation No skin changes  No results found for: TSH   Past Medical History:  Diagnosis Date  . Anemia   . Hypertension    maintained on exforge    Past Surgical History:  Procedure Laterality Date  . ABDOMINAL HYSTERECTOMY    . CESAREAN SECTION    . DILATION AND CURETTAGE OF UTERUS  07/2008   ablation  . ganglion cyst removal     wrist  . LAPAROSCOPIC SUPRACERVICAL HYSTERECTOMY  10/08/2011   Procedure: LAPAROSCOPIC SUPRACERVICAL HYSTERECTOMY;  Surgeon: Clarene Duke, MD;  Location: [facility];  Service: Gynecology;  Laterality: N/A;    Family History   Problem Relation Age of Onset  . Hypertension Mother   . Cancer Mother   . Hypertension Father   . Hypertension Sister   . Hypertension Brother   . Hypertension Sister     Social History   Socioeconomic History  . Marital status: Married    Spouse name: Not on file  . Number of children: Not on file  . Years of education: Not on file  . Highest education level: Not on file  Occupational History  . Occupation: Tourist information centre manager  Social Needs  . Financial resource strain: Not on file  . Food insecurity    Worry: Not on file    Inability: Not on file  . Transportation needs    Medical: Not on file    Non-medical: Not on file  Tobacco Use  . Smoking status: Never Smoker  . Smokeless tobacco: Never Used  Substance and Sexual Activity  . Alcohol use: No  . Drug use: No  . Sexual activity: Not on file  Lifestyle  . Physical activity    Days per week: Not on file    Minutes per session: Not on file  . Stress: Not on file  Relationships  . Social Herbalist on phone: Not on file    Gets together: Not on file    Attends religious service: Not on file    Active member of club or organization: Not on file  Attends meetings of clubs or organizations: Not on file    Relationship status: Not on file  . Intimate partner violence    Fear of current or ex partner: Not on file    Emotionally abused: Not on file    Physically abused: Not on file    Forced sexual activity: Not on file  Other Topics Concern  . Not on file  Social History Narrative  . Not on file    Outpatient Medications Prior to Visit  Medication Sig Dispense Refill  . amLODIPine-Valsartan-HCTZ 10-160-12.5 MG TABS Take 1 tablet by mouth daily. 90 tablet 3  . ibuprofen (ADVIL,MOTRIN) 800 MG tablet Take 1 tablet (800 mg total) by mouth 3 (three) times daily. 21 tablet 0  . Vitamin D, Ergocalciferol, (DRISDOL) 1.25 MG (50000 UT) CAPS capsule TAKE 1 CAPSULE BY MOUTH EVERY 7 DAYS 12 capsule 0   No  facility-administered medications prior to visit.     No Known Allergies  ROS Review of Systems Review of Systems  Constitutional: Negative for activity change, appetite change, chills and fever.  HENT: Negative for congestion, nosebleeds, trouble swallowing and voice change.   Respiratory: Negative for cough, shortness of breath and wheezing.   Gastrointestinal: Negative for diarrhea, nausea and vomiting.  Genitourinary: Negative for difficulty urinating, dysuria, flank pain and hematuria.  Musculoskeletal: Negative for back pain, joint swelling and neck pain.  Neurological: Negative for dizziness, speech difficulty, light-headedness and numbness.  See HPI. All other review of systems negative.      Objective:    Physical Exam  BP 110/78   Pulse 77   Temp 98.3 F (36.8 C) (Oral)   Resp 16   Ht 5' 4.5" (1.638 m)   Wt 196 lb 12.8 oz (89.3 kg)   LMP 09/21/2011   SpO2 100%   BMI 33.26 kg/m  Wt Readings from Last 3 Encounters:  04/17/19 196 lb 12.8 oz (89.3 kg)  10/14/18 179 lb 6.4 oz (81.4 kg)  02/28/18 196 lb 12.8 oz (89.3 kg)   Physical Exam  Constitutional: Oriented to person, place, and time. Appears well-developed and well-nourished.  HENT:  Head: Normocephalic and atraumatic.  Eyes: Conjunctivae and EOM are normal.  Cardiovascular: Normal rate, regular rhythm, normal heart sounds and intact distal pulses.  No murmur heard. Pulmonary/Chest: Effort normal and breath sounds normal. No stridor. No respiratory distress. Has no wheezes.  Neurological: Is alert and oriented to person, place, and time.  Skin: Skin is warm. Capillary refill takes less than 2 seconds.  Psychiatric: Has a normal mood and affect. Behavior is normal. Judgment and thought content normal.       CLINICAL DATA:  Thyromegaly  EXAM: THYROID ULTRASOUND  TECHNIQUE: Ultrasound examination of the thyroid gland and adjacent soft tissues was performed.  COMPARISON:  None.  FINDINGS:  Parenchymal Echotexture: Mildly heterogenous  Isthmus: 0.4 cm thickness  Right lobe: 6 x 1.4 x 2.4 cm  Left lobe: 5.7 x 1.4 x 2.1 cm  _________________________________________________________  Estimated total number of nodules >/= 1 cm: 0  Number of spongiform nodules >/=  2 cm not described below (TR1): 0  Number of mixed cystic and solid nodules >/= 1.5 cm not described below (TR2): 0  _________________________________________________________  No discrete nodules are seen within the thyroid gland.  IMPRESSION: 1. Mild thyromegaly with heterogenous parenchyma.  No nodule.  The above is in keeping with the ACR TI-RADS recommendations - J Am Coll Radiol 2017;14:587-595.   Electronically Signed   By: Keturah Barre  Vernard Gambles M.D.   On: 10/18/2018 17:31    Health Maintenance Due  Topic Date Due  . PAP SMEAR-Modifier  05/09/1990    There are no preventive care reminders to display for this patient.  No results found for: TSH Lab Results  Component Value Date   WBC 6.2 02/09/2018   HGB 11.9 02/09/2018   HCT 37.3 02/09/2018   MCV 94 02/09/2018   PLT 366 02/09/2018   Lab Results  Component Value Date   NA 140 02/09/2018   K 4.0 02/09/2018   CO2 26 02/09/2018   GLUCOSE 84 02/09/2018   BUN 11 02/09/2018   CREATININE 0.65 02/09/2018   BILITOT 0.2 02/09/2018   ALKPHOS 92 02/09/2018   AST 17 02/09/2018   ALT 15 02/09/2018   PROT 7.0 02/09/2018   ALBUMIN 4.0 02/09/2018   CALCIUM 9.3 02/09/2018   Lab Results  Component Value Date   CHOL 178 10/14/2018   Lab Results  Component Value Date   HDL 61 10/14/2018   Lab Results  Component Value Date   LDLCALC 101 (H) 10/14/2018   Lab Results  Component Value Date   TRIG 80 10/14/2018   Lab Results  Component Value Date   CHOLHDL 2.9 10/14/2018   No results found for: HGBA1C    Assessment & Plan:   Problem List Items Addressed This Visit    None    Visit Diagnoses    Essential hypertension    -   Primary Patient's blood pressure is at goal of 139/89 or less. Condition is stable. Continue current medications and treatment plan. I recommend that you exercise for 30-45 minutes 5 days a week. I also recommend a balanced diet with fruits and vegetables every day, lean meats, and little fried foods. The DASH diet (you can find this online) is a good example of this.    Relevant Orders   Basic metabolic panel   Lipid panel   Vitamin D deficiency    -  Will assess, pt has been supplementing   Relevant Orders   VITAMIN D 25 Hydroxy (Vit-D Deficiency, Fractures)   Enlarged thyroid gland    - will check    Relevant Orders   TSH      No orders of the defined types were placed in this encounter.   Follow-up: No follow-ups on file.    Forrest Moron, MD

## 2019-04-17 ENCOUNTER — Ambulatory Visit: Payer: BC Managed Care – PPO | Admitting: Family Medicine

## 2019-04-17 ENCOUNTER — Other Ambulatory Visit: Payer: Self-pay

## 2019-04-17 ENCOUNTER — Encounter: Payer: Self-pay | Admitting: Family Medicine

## 2019-04-17 VITALS — BP 110/78 | HR 77 | Temp 98.3°F | Resp 16 | Ht 64.5 in | Wt 196.8 lb

## 2019-04-17 DIAGNOSIS — E049 Nontoxic goiter, unspecified: Secondary | ICD-10-CM

## 2019-04-17 DIAGNOSIS — E559 Vitamin D deficiency, unspecified: Secondary | ICD-10-CM

## 2019-04-17 DIAGNOSIS — I10 Essential (primary) hypertension: Secondary | ICD-10-CM | POA: Diagnosis not present

## 2019-04-17 MED ORDER — AMLODIPINE-VALSARTAN-HCTZ 10-160-12.5 MG PO TABS: 1.0000 | ORAL_TABLET | Freq: Every day | ORAL | 3 refills | Status: DC

## 2019-04-17 NOTE — Patient Instructions (Addendum)
If you have lab work done today you will be contacted with your lab results within the next 2 weeks.  If you have not heard from [place] then please contact us. The fastest way to get your results is to register for My Chart.   IF you received an x-ray today, you will receive an invoice from [facility]. Please contact [facility] at [phone] with questions or concerns regarding your invoice.   IF you received labwork today, you will receive an invoice from [facility]. Please contact [facility] at [phone] with questions or concerns regarding your invoice.   Our billing staff will not be able to assist you with questions regarding bills from these companies.  You will be contacted with the lab results as soon as they are available. The fastest way to get your results is to activate your My Chart account. Instructions are located on the last page of this paperwork. If you have not heard from [place] regarding the results in 2 weeks, please contact this office.      Managing Your Hypertension Hypertension is commonly called high blood pressure. This is when the force of your blood pressing against the walls of your arteries is too strong. Arteries are blood vessels that carry blood from your heart throughout your body. Hypertension forces the heart to work harder to pump blood, and may cause the arteries to become narrow or stiff. Having untreated or uncontrolled hypertension can cause heart attack, stroke, kidney disease, and other problems. What are blood pressure readings? A blood pressure reading consists of a higher number over a lower number. Ideally, your blood pressure should be below 120/80. The first ("top") number is called the systolic pressure. It is a measure of the pressure in your arteries as your heart beats. The second ("bottom") number is called the diastolic pressure. It is a measure of the pressure in your arteries as the heart relaxes. What does my blood  pressure reading mean? Blood pressure is classified into four stages. Based on your blood pressure reading, your health care provider may use the following stages to determine what type of treatment you need, if any. Systolic pressure and diastolic pressure are measured in a unit called mm Hg. Normal  Systolic pressure: below 546.  Diastolic pressure: below 80. Elevated  Systolic pressure: 568-127.  Diastolic pressure: below 80. Hypertension stage 1  Systolic pressure: 517-001.  Diastolic pressure: 74-94. Hypertension stage 2  Systolic pressure: 496 or above.  Diastolic pressure: 90 or above. What health risks are associated with hypertension? Managing your hypertension is an important responsibility. Uncontrolled hypertension can lead to:  A heart attack.  A stroke.  A weakened blood vessel (aneurysm).  Heart failure.  Kidney damage.  Eye damage.  Metabolic syndrome.  Memory and concentration problems. What changes can I make to manage my hypertension? Hypertension can be managed by making lifestyle changes and possibly by taking medicines. Your health care provider will help you make a plan to bring your blood pressure within a normal range. Eating and drinking   Eat a diet that is high in fiber and potassium, and low in salt (sodium), added sugar, and fat. An example eating plan is called the DASH (Dietary Approaches to Stop Hypertension) diet. To eat this way: ? Eat plenty of fresh fruits and vegetables. Try to fill half of your plate at each meal with fruits and vegetables. ? Eat whole grains, such as whole wheat pasta, brown rice, or whole grain bread.  Fill about one quarter of your plate with whole grains. ? Eat low-fat diary products. ? Avoid fatty cuts of meat, processed or cured meats, and poultry with skin. Fill about one quarter of your plate with lean proteins such as fish, chicken without skin, beans, eggs, and tofu. ? Avoid premade and processed foods.  These tend to be higher in sodium, added sugar, and fat.  Reduce your daily sodium intake. Most people with hypertension should eat less than 1,500 mg of sodium a day.  Limit alcohol intake to no more than 1 drink a day for nonpregnant women and 2 drinks a day for men. One drink equals 12 oz of beer, 5 oz of wine, or 1 oz of hard liquor. Lifestyle  Work with your health care provider to maintain a healthy body weight, or to lose weight. Ask what an ideal weight is for you.  Get at least 30 minutes of exercise that causes your heart to beat faster (aerobic exercise) most days of the week. Activities may include walking, swimming, or biking.  Include exercise to strengthen your muscles (resistance exercise), such as weight lifting, as part of your weekly exercise routine. Try to do these types of exercises for 30 minutes at least 3 days a week.  Do not use any products that contain nicotine or tobacco, such as cigarettes and e-cigarettes. If you need help quitting, ask your health care provider.  Control any long-term (chronic) conditions you have, such as high cholesterol or diabetes. Monitoring  Monitor your blood pressure at home as told by your health care provider. Your personal target blood pressure may vary depending on your medical conditions, your age, and other factors.  Have your blood pressure checked regularly, as often as told by your health care provider. Working with your health care provider  Review all the medicines you take with your health care provider because there may be side effects or interactions.  Talk with your health care provider about your diet, exercise habits, and other lifestyle factors that may be contributing to hypertension.  Visit your health care provider regularly. Your health care provider can help you create and adjust your plan for managing hypertension. Will I need medicine to control my blood pressure? Your health care provider may prescribe  medicine if lifestyle changes are not enough to get your blood pressure under control, and if:  Your systolic blood pressure is 130 or higher.  Your diastolic blood pressure is 80 or higher. Take medicines only as told by your health care provider. Follow the directions carefully. Blood pressure medicines must be taken as prescribed. The medicine does not work as well when you skip doses. Skipping doses also puts you at risk for problems. Contact a health care provider if:  You think you are having a reaction to medicines you have taken.  You have repeated (recurrent) headaches.  You feel dizzy.  You have swelling in your ankles.  You have trouble with your vision. Get help right away if:  You develop a severe headache or confusion.  You have unusual weakness or numbness, or you feel faint.  You have severe pain in your chest or abdomen.  You vomit repeatedly.  You have trouble breathing. Summary  Hypertension is when the force of blood pumping through your arteries is too strong. If this condition is not controlled, it may put you at risk for serious complications.  Your personal target blood pressure may vary depending on your medical conditions, your age, and  other factors. For most people, a normal blood pressure is less than 120/80.  Hypertension is managed by lifestyle changes, medicines, or both. Lifestyle changes include weight loss, eating a healthy, low-sodium diet, exercising more, and limiting alcohol. This information is not intended to replace advice given to you by your health care provider. Make sure you discuss any questions you have with your health care provider. Document Released: 06/29/2012 Document Revised: 01/27/2019 Document Reviewed: 09/02/2016 Elsevier Patient Education  2020 [employer].

## 2019-04-18 LAB — BASIC METABOLIC PANEL
BUN/Creatinine Ratio: 11 (ref 9–23)
BUN: 9 mg/dL (ref 6–24)
CO2: 24 mmol/L (ref 20–29)
Calcium: 9.3 mg/dL (ref 8.7–10.2)
Chloride: 102 mmol/L (ref 96–106)
Creatinine, Ser: 0.83 mg/dL (ref 0.57–1.00)
GFR calc Af Amer: 96 mL/min/{1.73_m2} (ref >59)
GFR calc non Af Amer: 83 mL/min/{1.73_m2} (ref >59)
Glucose: 92 mg/dL (ref 65–99)
Potassium: 4.2 mmol/L (ref 3.5–5.2)
Sodium: 140 mmol/L (ref 134–144)

## 2019-04-18 LAB — LIPID PANEL
Chol/HDL Ratio: 2.9 ratio (ref 0.0–4.4)
Cholesterol, Total: 171 mg/dL (ref 100–199)
HDL: 58 mg/dL (ref >39)
LDL Calculated: 96 mg/dL (ref 0–99)
Triglycerides: 84 mg/dL (ref 0–149)
VLDL Cholesterol Cal: 17 mg/dL (ref 5–40)

## 2019-04-18 LAB — VITAMIN D 25 HYDROXY (VIT D DEFICIENCY, FRACTURES): Vit D, 25-Hydroxy: 34.7 ng/mL (ref 30.0–100.0)

## 2019-04-18 LAB — TSH: TSH: 1.06 u[IU]/mL (ref 0.450–4.500)

## 2019-10-05 ENCOUNTER — Ambulatory Visit
Admission: EM | Admit: 2019-10-05 | Discharge: 2019-10-05 | Disposition: A | Discharge status: Home / Self Care | Payer: BC Managed Care – PPO

## 2019-10-05 ENCOUNTER — Other Ambulatory Visit: Payer: Self-pay

## 2019-10-05 ENCOUNTER — Encounter: Payer: Self-pay | Admitting: Emergency Medicine

## 2019-10-05 DIAGNOSIS — Z20828 Contact with and (suspected) exposure to other viral communicable diseases: Secondary | ICD-10-CM

## 2019-10-05 DIAGNOSIS — Z20822 Contact with and (suspected) exposure to covid-19: Secondary | ICD-10-CM

## 2019-10-05 NOTE — ED Provider Notes (Signed)
[facility]    CSN: [id number] Arrival date & time: 10/05/19  1127      History   Chief Complaint Chief Complaint  Patient presents with  . Cough    HPI Tammy Brock is a 48 y.o. female.   48 year old female comes in for 7 day of URI symptoms.  Patient for started with cough, rhinorrhea, nasal congestion, and went for Covid testing.  1 to 2 days after Covid testing, noted loss of taste and smell, muscle pain.  States since then has also had body aches and chills.  Denies fever. Denies abdominal pain, nausea, vomiting, diarrhea.  Denies shortness of breath.  States Covid testing was negative, and therefore came in for evaluation.  Had positive Covid contact prior to symptom onset.  Patient states body aches is generalized, but also having aches and pains to bilateral lower legs.  States her friend recently passed away suddenly with recent leg pain, and therefore causing anxiety.  Given negative loss of taste and smell, patient wonders if she needs retesting.  She states URI symptoms has improved and almost resolved.  She denies one-sided leg swelling, history of blood clots, or birth control.      Past Medical History:  Diagnosis Date  . Anemia   . Hypertension    maintained on exforge    Patient Active Problem List   Diagnosis Date Noted  . Fibroid uterus 10/08/2011    Past Surgical History:  Procedure Laterality Date  . ABDOMINAL HYSTERECTOMY    . CESAREAN SECTION    . DILATION AND CURETTAGE OF UTERUS  07/2008   ablation  . ganglion cyst removal     wrist  . LAPAROSCOPIC SUPRACERVICAL HYSTERECTOMY  10/08/2011   Procedure: LAPAROSCOPIC SUPRACERVICAL HYSTERECTOMY;  Surgeon: Clarene Duke, MD;  Location: [facility];  Service: Gynecology;  Laterality: N/A;    OB History   No obstetric history on file.      Home Medications    Prior to Admission medications   Medication Sig Start Date End Date Taking? Authorizing Provider    amLODIPine-Valsartan-HCTZ 10-160-12.5 MG TABS Take 1 tablet by mouth daily. 04/17/19  Yes Delia Chimes A, MD  ibuprofen (ADVIL,MOTRIN) 800 MG tablet Take 1 tablet (800 mg total) by mouth 3 (three) times daily. 11/23/13   Sciacca, Marissa, PA-C  Vitamin D, Ergocalciferol, (DRISDOL) 1.25 MG (50000 UT) CAPS capsule TAKE 1 CAPSULE BY MOUTH EVERY 7 DAYS 01/02/19   Forrest Moron, MD    Family History Family History  Problem Relation Age of Onset  . Hypertension Mother   . Cancer Mother   . Hypertension Father   . Hypertension Sister   . Hypertension Brother   . Hypertension Sister     Social History Social History   Tobacco Use  . Smoking status: Never Smoker  . Smokeless tobacco: Never Used  Substance Use Topics  . Alcohol use: No  . Drug use: No     Allergies   Patient has no known allergies.   Review of Systems Review of Systems  Reason unable to perform ROS: See HPI as above.     Physical Exam Triage Vital Signs ED Triage Vitals [10/05/19 1139]  Enc Vitals Group     BP (!) 150/90     Pulse Rate 79     Resp 18     Temp 97.6 F (36.4 C)     Temp Source Temporal     SpO2 98 %  Weight      Height      Head Circumference      Peak Flow      Pain Score 6     Pain Loc      Pain Edu?      Excl. in [place]?    No data found.  Updated Vital Signs BP (!) 150/90 (BP Location: Left Arm)   Pulse 79   Temp 97.6 F (36.4 C) (Temporal)   Resp 18   LMP 09/21/2011   SpO2 98%   Physical Exam Constitutional:      General: She is not in acute distress.    Appearance: Normal appearance. She is not ill-appearing, toxic-appearing or diaphoretic.  HENT:     Head: Normocephalic and atraumatic.     Mouth/Throat:     Mouth: Mucous membranes are moist.     Pharynx: Oropharynx is clear. Uvula midline.  Cardiovascular:     Rate and Rhythm: Normal rate and regular rhythm.     Heart sounds: Normal heart sounds. No murmur. No friction rub. No gallop.   Pulmonary:      Effort: Pulmonary effort is normal. No accessory muscle usage, prolonged expiration, respiratory distress or retractions.     Comments: Lungs clear to auscultation without adventitious lung sounds. Musculoskeletal:     Cervical back: Normal range of motion and neck supple.     Comments: No swelling, erythema, warmth, contusion.  No tenderness to palpation of lower extremities bilaterally.  Full range of motion of knee and ankles.  Negative Homans.  Neurological:     General: No focal deficit present.     Mental Status: She is alert and oriented to person, place, and time.      UC Treatments / Results  Labs (all labs ordered are listed, but only abnormal results are displayed) Labs Reviewed - No data to display  EKG   Radiology No results found.  Procedures Procedures (including critical care time)  Medications Ordered in UC Medications - No data to display  Initial Impression / Assessment and Plan / UC Course  I have reviewed the triage vital signs and the nursing notes.  Pertinent labs & imaging results that were available during my care of the patient were reviewed by me and considered in my medical decision making (see chart for details).    Exam without alarming signs.  Given current symptom onset at day 7 with improvement of URI symptoms, discussed even with retesting positive, will be able to leave quarantine after another 3 to 4 days.  Patient expresses understanding, and would like to defer testing at this time. Will have patient continue to quarantine for the next 3 days, if continue improvement of symptoms, can leave quarantine.  Return precautions given.  Patient expresses understanding and agrees to plan.  Final Clinical Impressions(s) / UC Diagnoses   Final diagnoses:  Suspected COVID-19 virus infection    ED Prescriptions    None     PDMP not reviewed this encounter.   Ok Edwards, PA-C 10/05/19 1510

## 2019-10-05 NOTE — ED Notes (Signed)
Patient able to ambulate independently  

## 2019-10-05 NOTE — ED Triage Notes (Addendum)
Pt presents to [facility] for assessment of 1 week of cough, nasal congestion.  COVID done 12/10, Negative yesterday.  Pt states the 11th and 12th she lost sense of taste and smell, back pain.  The last few days she has been having aches in the muscles of her legs.  Pt had positive known exposure.  Denies SOB.  Pt curious if she should be retested.  C/o some anxiety related to a friend who died suddenly recently with leg pain a few days prior.

## 2019-10-05 NOTE — Discharge Instructions (Signed)
Please quarantine until 10/18/2019. You can take over the counter flonase/nasacort to help with nasal congestion/drainage. If experiencing shortness of breath, trouble breathing, go to the emergency department for further evaluation needed.

## 2019-10-08 ENCOUNTER — Ambulatory Visit
Admission: EM | Admit: 2019-10-08 | Discharge: 2019-10-08 | Disposition: A | Discharge status: Home / Self Care | Payer: BC Managed Care – PPO | Attending: Physician Assistant | Admitting: Physician Assistant

## 2019-10-08 ENCOUNTER — Telehealth: Payer: Self-pay

## 2019-10-08 ENCOUNTER — Other Ambulatory Visit: Payer: Self-pay

## 2019-10-08 DIAGNOSIS — R21 Rash and other nonspecific skin eruption: Secondary | ICD-10-CM | POA: Diagnosis not present

## 2019-10-08 DIAGNOSIS — Z20828 Contact with and (suspected) exposure to other viral communicable diseases: Secondary | ICD-10-CM

## 2019-10-08 DIAGNOSIS — Z20822 Contact with and (suspected) exposure to covid-19: Secondary | ICD-10-CM

## 2019-10-08 LAB — CBC WITH DIFFERENTIAL/PLATELET
Basophils Absolute: 0 10*3/uL (ref 0.0–0.2)
Basos: 0 %
EOS (ABSOLUTE): 0 10*3/uL (ref 0.0–0.4)
Eos: 0 %
Hematocrit: 39.7 % (ref 34.0–46.6)
Hemoglobin: 13.1 g/dL (ref 11.1–15.9)
Immature Grans (Abs): 0 10*3/uL (ref 0.0–0.1)
Immature Granulocytes: 0 %
Lymphocytes Absolute: 2.2 10*3/uL (ref 0.7–3.1)
Lymphs: 33 %
MCH: 30.3 pg (ref 26.6–33.0)
MCHC: 33 g/dL (ref 31.5–35.7)
MCV: 92 fL (ref 79–97)
Monocytes Absolute: 0.2 10*3/uL (ref 0.1–0.9)
Monocytes: 3 %
Neutrophils Absolute: 4.4 10*3/uL (ref 1.4–7.0)
Neutrophils: 64 %
Platelets: 316 10*3/uL (ref 150–450)
RBC: 4.33 x10E6/uL (ref 3.77–5.28)
RDW: 12.8 % (ref 11.7–15.4)
WBC: 6.9 10*3/uL (ref 3.4–10.8)

## 2019-10-08 LAB — BASIC METABOLIC PANEL
BUN/Creatinine Ratio: 22 (ref 9–23)
BUN: 14 mg/dL (ref 6–24)
CO2: 23 mmol/L (ref 20–29)
Calcium: 9.5 mg/dL (ref 8.7–10.2)
Chloride: 105 mmol/L (ref 96–106)
Creatinine, Ser: 0.64 mg/dL (ref 0.57–1.00)
GFR calc Af Amer: 120 mL/min/{1.73_m2} (ref >59)
GFR calc non Af Amer: 104 mL/min/{1.73_m2} (ref >59)
Glucose: 87 mg/dL (ref 65–99)
Potassium: 4.2 mmol/L (ref 3.5–5.2)
Sodium: 141 mmol/L (ref 134–144)

## 2019-10-08 LAB — PROTIME-INR
INR: 1 (ref 0.9–1.2)
Prothrombin Time: 10.8 s (ref 9.1–12.0)

## 2019-10-08 MED ORDER — PREDNISONE 50 MG PO TABS: 50.0000 mg | ORAL_TABLET | Freq: Every day | ORAL | 0 refills | Status: DC

## 2019-10-08 NOTE — ED Triage Notes (Signed)
Pt c/o rash to both elbows and both thighs since Friday. States had 2 doses of benadryl yesterday and woke up this am and has whelps to both legs and swelling to lips. States feels very achy. Denies any SOB

## 2019-10-08 NOTE — Discharge Instructions (Addendum)
As discussed, we have drawn CBC, BMP, PT/INR for further evaluation. Start prednisone as directed. Monitor for any new intake/usage. If noticing worsening lip swelling, trouble breathing, drooling, leaning forward to breath, call 911.

## 2019-10-08 NOTE — ED Provider Notes (Signed)
[facility]    CSN: [id number] Arrival date & time: 10/08/19  0802      History   Chief Complaint Chief Complaint  Patient presents with  . Rash    HPI Tammy Brock is a 48 y.o. female.   48 year old female comes in for 3 day history of rash, 2 day history of lip swelling. Unknown new contact. She noticed rash to bilateral elbows and thighs that is pruritic in nature. She also noted to have bilateral hand swelling with soreness for the past 2 days. denies swelling to the throat, tripoding, drooling, trismus. She is currently in quarantine for possible COVID, with today being last day to quarantine. Denies fever, chills, body aches. Denies abdominal pain, nausea, vomiting, diarrhea. Denies shortness of breath. Her loss of taste and smell has been improving. Has residual mild congestion, otherwise URI symptoms resolved. Took benadryl with some relief.      Past Medical History:  Diagnosis Date  . Anemia   . Hypertension    maintained on exforge    Patient Active Problem List   Diagnosis Date Noted  . Fibroid uterus 10/08/2011    Past Surgical History:  Procedure Laterality Date  . ABDOMINAL HYSTERECTOMY    . CESAREAN SECTION    . DILATION AND CURETTAGE OF UTERUS  07/2008   ablation  . ganglion cyst removal     wrist  . LAPAROSCOPIC SUPRACERVICAL HYSTERECTOMY  10/08/2011   Procedure: LAPAROSCOPIC SUPRACERVICAL HYSTERECTOMY;  Surgeon: Clarene Duke, MD;  Location: [facility];  Service: Gynecology;  Laterality: N/A;    OB History   No obstetric history on file.      Home Medications    Prior to Admission medications   Medication Sig Start Date End Date Taking? Authorizing Provider  amLODIPine-Valsartan-HCTZ 10-160-12.5 MG TABS Take 1 tablet by mouth daily. 04/17/19   Forrest Moron, MD  ibuprofen (ADVIL,MOTRIN) 800 MG tablet Take 1 tablet (800 mg total) by mouth 3 (three) times daily. 11/23/13   Sciacca, Marissa, PA-C  predniSONE (DELTASONE) 50 MG  tablet Take 1 tablet (50 mg total) by mouth daily with breakfast. 10/08/19   Tasia Catchings, Amy V, PA-C  Vitamin D, Ergocalciferol, (DRISDOL) 1.25 MG (50000 UT) CAPS capsule TAKE 1 CAPSULE BY MOUTH EVERY 7 DAYS 01/02/19   Forrest Moron, MD    Family History Family History  Problem Relation Age of Onset  . Hypertension Mother   . Cancer Mother   . Hypertension Father   . Hypertension Sister   . Hypertension Brother   . Hypertension Sister     Social History Social History   Tobacco Use  . Smoking status: Never Smoker  . Smokeless tobacco: Never Used  Substance Use Topics  . Alcohol use: No  . Drug use: No     Allergies   Patient has no known allergies.   Review of Systems Review of Systems  Reason unable to perform ROS: See HPI as above.     Physical Exam Triage Vital Signs ED Triage Vitals [10/08/19 0813]  Enc Vitals Group     BP 136/90     Pulse Rate 98     Resp 18     Temp 98.2 F (36.8 C)     Temp Source (S) Oral     SpO2 97 %     Weight      Height      Head Circumference      Peak Flow  Pain Score 0     Pain Loc      Pain Edu?      Excl. in [place]?    No data found.  Updated Vital Signs BP 136/90 (BP Location: Left Arm)   Pulse 98   Temp (S) 98.2 F (36.8 C) (Oral)   Resp 18   LMP 09/21/2011   SpO2 97%   Physical Exam Constitutional:      General: She is not in acute distress.    Appearance: Normal appearance. She is not ill-appearing, toxic-appearing or diaphoretic.  HENT:     Head: Normocephalic and atraumatic.     Mouth/Throat:     Mouth: Mucous membranes are moist.     Pharynx: Oropharynx is clear. Uvula midline.     Comments: Mild upper and lower lip swelling diffusely.  No oral swelling.  Patient handling own secretions well. Cardiovascular:     Rate and Rhythm: Normal rate and regular rhythm.     Heart sounds: Normal heart sounds. No murmur. No friction rub. No gallop.   Pulmonary:     Effort: Pulmonary effort is normal. No  accessory muscle usage, prolonged expiration, respiratory distress or retractions.     Comments: Lungs clear to auscultation without adventitious lung sounds. Musculoskeletal:     Cervical back: Normal range of motion and neck supple.  Neurological:     General: No focal deficit present.     Mental Status: She is alert and oriented to person, place, and time.              UC Treatments / Results  Labs (all labs ordered are listed, but only abnormal results are displayed) Labs Reviewed  PROTIME-INR   Narrative:    Performed at:  [address] [place], [place]  [id number] Lab Director: Rush Farmer MD, Phone:  [phone]  CBC WITH DIFFERENTIAL/PLATELET   Narrative:    Performed at:  [address] [place], [place]  [id number] Lab Director: Rush Farmer MD, Phone:  [phone]  BASIC METABOLIC PANEL   Narrative:    Performed at:  [address], [place], [place]  [id number] Lab Director: Rush Farmer MD, Phone:  [phone]    EKG   Radiology No results found.  Procedures Procedures (including critical care time)  Medications Ordered in UC Medications - No data to display  Initial Impression / Assessment and Plan / UC Course  I have reviewed the triage vital signs and the nursing notes.  Pertinent labs & imaging results that were available during my care of the patient were reviewed by me and considered in my medical decision making (see chart for details).    Discussed case with Dr Lanny Cramp. ?COVID rash vs allergic urticaria. No obvious new exposures at this time.  Lip swelling is stable, without any airway obstruction.  Will draw PT/INR, CBC, BMP for further evaluation needed.  Will put patient on prednisone, and keep food diary.  We will continue to monitor, strict return precautions given.  Otherwise patient discharged in stable condition pending lab results.  Patient expresses  understanding and agrees to plan.  Labs within normal limits.  Patient called by RN to inform of results.  Patient states since starting prednisone, has had improvement of lip swelling, and rash.  We will continue to monitor.  Will touch base tomorrow, and discuss quarantine instructions.  Final Clinical Impressions(s) / UC Diagnoses   Final diagnoses:  Rash and nonspecific skin  eruption  Suspected COVID-19 virus infection   ED Prescriptions    Medication Sig Dispense Auth. Provider   predniSONE (DELTASONE) 50 MG tablet Take 1 tablet (50 mg total) by mouth daily with breakfast. 5 tablet Ok Edwards, PA-C     PDMP not reviewed this encounter.   Ok Edwards, PA-C 10/08/19 [phone]

## 2019-10-09 ENCOUNTER — Other Ambulatory Visit: Payer: Self-pay

## 2019-10-09 ENCOUNTER — Ambulatory Visit: Payer: BC Managed Care – PPO | Admitting: Family Medicine

## 2019-10-09 ENCOUNTER — Encounter: Payer: Self-pay | Admitting: Family Medicine

## 2019-10-09 VITALS — BP 134/76 | HR 80 | Temp 98.2°F | Resp 17 | Ht 64.5 in | Wt 194.4 lb

## 2019-10-09 DIAGNOSIS — Z20828 Contact with and (suspected) exposure to other viral communicable diseases: Secondary | ICD-10-CM

## 2019-10-09 DIAGNOSIS — I10 Essential (primary) hypertension: Secondary | ICD-10-CM | POA: Diagnosis not present

## 2019-10-09 DIAGNOSIS — Z20822 Contact with and (suspected) exposure to covid-19: Secondary | ICD-10-CM

## 2019-10-09 DIAGNOSIS — L509 Urticaria, unspecified: Secondary | ICD-10-CM

## 2019-10-09 NOTE — Progress Notes (Signed)
Established Patient Office Visit  Subjective:  Patient ID: Tammy Brock, female    DOB: 1971/01/18  Age: 48 y.o. MRN: [id number]  CC:  Chief Complaint  Patient presents with  . Hypertension    6 month recheck  . rash since Saturday    starting saturday morning and tried benadryl and it helped, but woke up Sunday morning with swollen lips so pt went to Urgent Care and was given prednisone and it helped.  Pt advises she has covid 19 symptoms of bodyaches, loss of taste/smell but no fever.  Pt tested on 09/26/2019 and was negative but continues to have symptoms associated with covid    HPI Tammy Brock presents for   Patient reports that she started having a cluster of covid like symptoms on 12/8 She had covid testing on 12/10 that was NEGATIVE SHE STATES THAT SHE HAS ITCHING AND HIVES AS WELL AS LACK OF TASTE AND SMELL.  She was given a prednisone burst Prednisone 50mg  for 5 days.  She is on day 2. She still has itching and states that she has some raised hives.  Hypertension: Patient here for follow-up of elevated blood pressure. She is exercising and is adherent to low salt diet.  Blood pressure is well controlled at home. Cardiac symptoms none. Patient denies chest pain, claudication, fatigue, irregular heart beat, lower extremity edema, near-syncope and orthopnea.  Cardiovascular risk factors: hypertension. Use of agents associated with hypertension: none. History of target organ damage: none. BP Readings from Last 3 Encounters:  10/09/19 134/76  10/08/19 136/90  10/05/19 (!) 150/90     Past Medical History:  Diagnosis Date  . Anemia   . Hypertension    maintained on exforge    Past Surgical History:  Procedure Laterality Date  . ABDOMINAL HYSTERECTOMY    . CESAREAN SECTION    . DILATION AND CURETTAGE OF UTERUS  07/2008   ablation  . ganglion cyst removal     wrist  . LAPAROSCOPIC SUPRACERVICAL HYSTERECTOMY  10/08/2011   Procedure: LAPAROSCOPIC SUPRACERVICAL  HYSTERECTOMY;  Surgeon: Clarene Duke, MD;  Location: [facility];  Service: Gynecology;  Laterality: N/A;    Family History  Problem Relation Age of Onset  . Hypertension Mother   . Cancer Mother   . Hypertension Father   . Hypertension Sister   . Hypertension Brother   . Hypertension Sister     Social History   Socioeconomic History  . Marital status: Married    Spouse name: Not on file  . Number of children: Not on file  . Years of education: Not on file  . Highest education level: Not on file  Occupational History  . Occupation: Educator  Tobacco Use  . Smoking status: Never Smoker  . Smokeless tobacco: Never Used  Substance and Sexual Activity  . Alcohol use: No  . Drug use: No  . Sexual activity: Not on file  Other Topics Concern  . Not on file  Social History Narrative  . Not on file   Social Determinants of Health   Financial Resource Strain:   . Difficulty of Paying Living Expenses: Not on file  Food Insecurity:   . Worried About Charity fundraiser in the Last Year: Not on file  . Ran Out of Food in the Last Year: Not on file  Transportation Needs:   . Lack of Transportation (Medical): Not on file  . Lack of Transportation (Non-Medical): Not on file  Physical Activity:   .  Days of Exercise per Week: Not on file  . Minutes of Exercise per Session: Not on file  Stress:   . Feeling of Stress : Not on file  Social Connections:   . Frequency of Communication with Friends and Family: Not on file  . Frequency of Social Gatherings with Friends and Family: Not on file  . Attends Religious Services: Not on file  . Active Member of Clubs or Organizations: Not on file  . Attends Archivist Meetings: Not on file  . Marital Status: Not on file  Intimate Partner Violence:   . Fear of Current or Ex-Partner: Not on file  . Emotionally Abused: Not on file  . Physically Abused: Not on file  . Sexually Abused: Not on file    Outpatient Medications Prior  to Visit  Medication Sig Dispense Refill  . amLODIPine-Valsartan-HCTZ 10-160-12.5 MG TABS Take 1 tablet by mouth daily. 90 tablet 3  . predniSONE (DELTASONE) 50 MG tablet Take 1 tablet (50 mg total) by mouth daily with breakfast. 5 tablet 0  . ibuprofen (ADVIL,MOTRIN) 800 MG tablet Take 1 tablet (800 mg total) by mouth 3 (three) times daily. (Patient not taking: Reported on 10/09/2019) 21 tablet 0  . Vitamin D, Ergocalciferol, (DRISDOL) 1.25 MG (50000 UT) CAPS capsule TAKE 1 CAPSULE BY MOUTH EVERY 7 DAYS (Patient not taking: Reported on 10/09/2019) 12 capsule 0   No facility-administered medications prior to visit.    No Known Allergies  ROS Review of Systems Review of Systems  Constitutional: Negative for activity change, appetite change, chills and fever.  HENT: Negative for congestion, nosebleeds, trouble swallowing and voice change.   Respiratory: Negative for cough, shortness of breath and wheezing.   Gastrointestinal: Negative for diarrhea, nausea and vomiting.  Genitourinary: Negative for difficulty urinating, dysuria, flank pain and hematuria.  Musculoskeletal: Negative for back pain, joint swelling and neck pain.  Neurological: Negative for dizziness, speech difficulty, light-headedness and numbness.  See HPI. All other review of systems negative.     Objective:    Physical Exam  BP 134/76 (BP Location: Left Arm, Patient Position: Sitting, Cuff Size: Large)   Pulse 80   Temp 98.2 F (36.8 C) (Oral)   Resp 17   Ht 5' 4.5" (1.638 m)   Wt 194 lb 6.4 oz (88.2 kg)   LMP 09/21/2011   SpO2 100%   BMI 32.85 kg/m  Wt Readings from Last 3 Encounters:  10/09/19 194 lb 6.4 oz (88.2 kg)  04/17/19 196 lb 12.8 oz (89.3 kg)  10/14/18 179 lb 6.4 oz (81.4 kg)   Physical Exam  Constitutional: Oriented to person, place, and time. Appears well-developed and well-nourished.  HENT:  Head: Normocephalic and atraumatic.  Eyes: Conjunctivae and EOM are normal.  Cardiovascular: Normal  rate, regular rhythm, normal heart sounds and intact distal pulses.  No murmur heard. Pulmonary/Chest: Effort normal and breath sounds normal. No stridor. No respiratory distress. Has no wheezes.  Neurological: Is alert and oriented to person, place, and time.  Skin: Skin is warm. Capillary refill takes less than 2 seconds.  Psychiatric: Has a normal mood and affect. Behavior is normal. Judgment and thought content normal.    Health Maintenance Due  Topic Date Due  . TETANUS/TDAP  05/09/1988  . PAP SMEAR-Modifier  05/09/1990  . MAMMOGRAM  05/10/2019  . COLONOSCOPY  05/10/2019    There are no preventive care reminders to display for this patient.  Lab Results  Component Value Date   TSH 1.060  04/17/2019   Lab Results  Component Value Date   WBC 6.9 10/08/2019   HGB 13.1 10/08/2019   HCT 39.7 10/08/2019   MCV 92 10/08/2019   PLT 316 10/08/2019   Lab Results  Component Value Date   NA 141 10/08/2019   K 4.2 10/08/2019   CO2 23 10/08/2019   GLUCOSE 87 10/08/2019   BUN 14 10/08/2019   CREATININE 0.64 10/08/2019   BILITOT 0.2 02/09/2018   ALKPHOS 92 02/09/2018   AST 17 02/09/2018   ALT 15 02/09/2018   PROT 7.0 02/09/2018   ALBUMIN 4.0 02/09/2018   CALCIUM 9.5 10/08/2019   Lab Results  Component Value Date   CHOL 171 04/17/2019   Lab Results  Component Value Date   HDL 58 04/17/2019   Lab Results  Component Value Date   LDLCALC 96 04/17/2019   Lab Results  Component Value Date   TRIG 84 04/17/2019   Lab Results  Component Value Date   CHOLHDL 2.9 04/17/2019   No results found for: HGBA1C    Assessment & Plan:   Problem List Items Addressed This Visit    None    Visit Diagnoses    Essential hypertension    -  Primary -    Patient's blood pressure is at goal of 139/89 or less. Condition is stable. Continue current medications and treatment plan. I recommend that you exercise for 30-45 minutes 5 days a week. I also recommend a balanced diet with  fruits and vegetables every day, lean meats, and little fried foods. The DASH diet (you can find this online) is a good example of this.    Close exposure to COVID-19 virus        Close exposure to covid -  Patient appropriately isolated and quarantined -   Patient was advised to return for covid antibody testing  HIVES  -   Continue prednisone -   Advised antihistamine  No orders of the defined types were placed in this encounter.   Follow-up: No follow-ups on file.    A total of 30 minutes were spent face-to-face with the patient during this encounter and over half of that time was spent on counseling and coordination of care.   Forrest Moron, MD

## 2019-10-09 NOTE — Patient Instructions (Signed)
° ° ° °  If you have lab work done today you will be contacted with your lab results within the next 2 weeks.  If you have not heard from us then please contact us. The fastest way to get your results is to register for My Chart. ° ° °IF you received an x-ray today, you will receive an invoice from [facility]. Please contact [facility] at [phone] with questions or concerns regarding your invoice.  ° °IF you received labwork today, you will receive an invoice from [facility]. Please contact [facility] at [phone] with questions or concerns regarding your invoice.  ° °Our billing staff will not be able to assist you with questions regarding bills from these companies. ° °You will be contacted with the lab results as soon as they are available. The fastest way to get your results is to activate your My Chart account. Instructions are located on the last page of this paperwork. If you have not heard from us regarding the results in 2 weeks, please contact this office. °  ° ° ° °

## 2019-10-18 ENCOUNTER — Other Ambulatory Visit: Payer: Self-pay

## 2019-10-18 ENCOUNTER — Encounter: Payer: Self-pay | Admitting: Family Medicine

## 2019-10-18 ENCOUNTER — Telehealth (INDEPENDENT_AMBULATORY_CARE_PROVIDER_SITE_OTHER): Payer: BC Managed Care – PPO | Admitting: Family Medicine

## 2019-10-18 DIAGNOSIS — K649 Unspecified hemorrhoids: Secondary | ICD-10-CM | POA: Diagnosis not present

## 2019-10-18 DIAGNOSIS — Z1211 Encounter for screening for malignant neoplasm of colon: Secondary | ICD-10-CM

## 2019-10-18 NOTE — Progress Notes (Signed)
[id number]: 10/14/19 and 10/15/19 during the night after wiping, she noticed blood- per pt the blood was neither bright red or dark red and was inbetween the two.  Pt admits she strained alot to release the poop. Pt advises she felt a hemorrhoid sever weeks ago so she thought this might be the reason for the blood in stool.  Per pt yesterday 1st days she didn't have a bowel movement.  Per pt increased water intake, veggies and oatmeal to help with consipation and reduce straining. No recent weight or bp taken.  No refills needed.  No travel outside the [place] or [place] in the past 3 weeks.

## 2019-10-18 NOTE — Patient Instructions (Signed)
° ° ° °  If you have lab work done today you will be contacted with your lab results within the next 2 weeks.  If you have not heard from us then please contact us. The fastest way to get your results is to register for My Chart. ° ° °IF you received an x-ray today, you will receive an invoice from [facility]. Please contact [facility] at [phone] with questions or concerns regarding your invoice.  ° °IF you received labwork today, you will receive an invoice from [facility]. Please contact [facility] at [phone] with questions or concerns regarding your invoice.  ° °Our billing staff will not be able to assist you with questions regarding bills from these companies. ° °You will be contacted with the lab results as soon as they are available. The fastest way to get your results is to activate your My Chart account. Instructions are located on the last page of this paperwork. If you have not heard from us regarding the results in 2 weeks, please contact this office. °  ° ° ° °

## 2019-10-19 ENCOUNTER — Encounter: Payer: Self-pay | Admitting: Gastroenterology

## 2019-10-19 NOTE — Progress Notes (Signed)
Telemedicine Encounter- SOAP NOTE Established Patient  This telephone encounter was conducted with the patient's (or proxy's) verbal consent via audio telecommunications: yes/no: Yes Patient was instructed to have this encounter in a suitably private space; and to only have persons present to whom they give permission to participate. In addition, patient identity was confirmed by use of name plus two identifiers (DOB and address).  I discussed the limitations, risks, security and privacy concerns of performing an evaluation and management service by telephone and the availability of in person appointments. I also discussed with the patient that there may be a patient responsible charge related to this service. The patient expressed understanding and agreed to proceed.  I spent a total of TIME; 0 MIN TO 60 MIN: 15 minutes talking with the patient or their proxy.  Chief Complaint  Patient presents with  . Blood In Stools    onset: 10/14/19 and 10/15/19 during the night after wiping, she noticed blood- per pt the blood was neither bright red or dark red and was inbetween the two.  Pt admits she strained alot to release the poop. Pt advises she felt a hemorrhoid sever weeks ago so she thought this might be the reason for the blood in stool.  Per pt yesterday 1st days she didn't have a bowel movement.  Per pt increased water intake, veggies and oatmeal to help with consipation and reduce straining.    Subjective   Tammy Brock is a 48 y.o. established patient. Telephone visit today for  HPI  Pt reports that she has been having blood in her stool She states that there was blood in the toilet She states that she had a hard stool and it felt like pain Sometimes her anus itches She states that she increased her water intake, veggies and oatmeal.  She had covid and is now recovering.   She denies family history of colon cancer, unexplained weight loss or fatigue.   She has never had  colonoscopy.  She was considering taking miralax because she can feel like something is there after a bowel movement.    There is no vaginal discharge.   Patient Active Problem List   Diagnosis Date Noted  . Fibroid uterus 10/08/2011    Past Medical History:  Diagnosis Date  . Anemia   . Hypertension    maintained on exforge    Current Outpatient Medications  Medication Sig Dispense Refill  . amLODIPine-Valsartan-HCTZ 10-160-12.5 MG TABS Take 1 tablet by mouth daily. 90 tablet 3  . ibuprofen (ADVIL,MOTRIN) 800 MG tablet Take 1 tablet (800 mg total) by mouth 3 (three) times daily. 21 tablet 0  . predniSONE (DELTASONE) 50 MG tablet Take 1 tablet (50 mg total) by mouth daily with breakfast. (Patient not taking: Reported on 10/18/2019) 5 tablet 0  . Vitamin D, Ergocalciferol, (DRISDOL) 1.25 MG (50000 UT) CAPS capsule TAKE 1 CAPSULE BY MOUTH EVERY 7 DAYS (Patient not taking: Reported on 10/09/2019) 12 capsule 0   No current facility-administered medications for this visit.    No Known Allergies  Social History   Socioeconomic History  . Marital status: Married    Spouse name: Not on file  . Number of children: Not on file  . Years of education: Not on file  . Highest education level: Not on file  Occupational History  . Occupation: Educator  Tobacco Use  . Smoking status: Never Smoker  . Smokeless tobacco: Never Used  Substance and Sexual Activity  . Alcohol  use: No  . Drug use: No  . Sexual activity: Not on file  Other Topics Concern  . Not on file  Social History Narrative  . Not on file   Social Determinants of Health   Financial Resource Strain:   . Difficulty of Paying Living Expenses: Not on file  Food Insecurity:   . Worried About Charity fundraiser in the Last Year: Not on file  . Ran Out of Food in the Last Year: Not on file  Transportation Needs:   . Lack of Transportation (Medical): Not on file  . Lack of Transportation (Non-Medical): Not on file    Physical Activity:   . Days of Exercise per Week: Not on file  . Minutes of Exercise per Session: Not on file  Stress:   . Feeling of Stress : Not on file  Social Connections:   . Frequency of Communication with Friends and Family: Not on file  . Frequency of Social Gatherings with Friends and Family: Not on file  . Attends Religious Services: Not on file  . Active Member of Clubs or Organizations: Not on file  . Attends Archivist Meetings: Not on file  . Marital Status: Not on file  Intimate Partner Violence:   . Fear of Current or Ex-Partner: Not on file  . Emotionally Abused: Not on file  . Physically Abused: Not on file  . Sexually Abused: Not on file    ROS  Review of Systems  Constitutional: Negative for activity change, appetite change, chills and fever.  HENT: Negative for congestion, nosebleeds, trouble swallowing and voice change.   Respiratory: Negative for cough, shortness of breath and wheezing.   Gastrointestinal: Negative for diarrhea, nausea and vomiting.  Genitourinary: Negative for difficulty urinating, dysuria, flank pain and hematuria.  Musculoskeletal: Negative for back pain, joint swelling and neck pain.  Neurological: Negative for dizziness, speech difficulty, light-headedness and numbness.  See HPI. All other review of systems negative.    Objective    Virtual visit so no exam was performed  Vitals as reported by the patient: There were no vitals filed for this visit.  Renae was seen today for blood in stools.  Diagnoses and all orders for this visit:  Bleeding hemorrhoid -     Ambulatory referral to Gastroenterology  Special screening for malignant neoplasms, colon -     Ambulatory referral to Gastroenterology   Advised increasing fiber, hydration and walking Will refer to GI for colonoscopy since she is due anyway There are no red flags at time so this is a routine screening.    I discussed the assessment and treatment plan  with the patient. The patient was provided an opportunity to ask questions and all were answered. The patient agreed with the plan and demonstrated an understanding of the instructions.   The patient was advised to call back or seek an in-person evaluation if the symptoms worsen or if the condition fails to improve as anticipated.  I provided 15 minutes of non-face-to-face time during this encounter.  Forrest Moron, MD  Primary Care at [facility]

## 2019-10-27 ENCOUNTER — Other Ambulatory Visit: Payer: Self-pay

## 2019-10-27 ENCOUNTER — Encounter: Payer: Self-pay | Admitting: Gastroenterology

## 2019-10-27 ENCOUNTER — Ambulatory Visit: Payer: BC Managed Care – PPO | Admitting: Gastroenterology

## 2019-10-27 VITALS — BP 140/80 | HR 88 | Temp 97.6°F | Ht 64.5 in | Wt 197.8 lb

## 2019-10-27 DIAGNOSIS — Z1211 Encounter for screening for malignant neoplasm of colon: Secondary | ICD-10-CM

## 2019-10-27 DIAGNOSIS — Z01818 Encounter for other preprocedural examination: Secondary | ICD-10-CM

## 2019-10-27 DIAGNOSIS — K625 Hemorrhage of anus and rectum: Secondary | ICD-10-CM

## 2019-10-27 NOTE — Patient Instructions (Addendum)
If you are age 51 or older, your body mass index should be between 23-30. Your Body mass index is 33.43 kg/m. If this is out of the aforementioned range listed, please consider follow up with your Primary Care Provider.  If you are age 65 or younger, your body mass index should be between 19-25. Your Body mass index is 33.43 kg/m. If this is out of the aformentioned range listed, please consider follow up with your Primary Care Provider.   You have been scheduled for a colonoscopy. Please follow written instructions given to you at your visit today.  Please pick up your prep supplies at the pharmacy within the next 1-3 days. If you use inhalers (even only as needed), please bring them with you on the day of your procedure. Your physician has requested that you go to www.startemmi.com and enter the access code given to you at your visit today. This web site gives a general overview about your procedure. However, you should still follow specific instructions given to you by our office regarding your preparation for the procedure.  We have given you samples of the following medication to take: Sutab [phone] EXP 12-2020)    It was a pleasure to see you today!  Dr. Loletha Carrow

## 2019-10-27 NOTE — Progress Notes (Signed)
[facility] Consult Note:  History: Tammy Brock 10/27/2019  Referring provider: Forrest Moron, MD  Reason for consult/chief complaint: Rectal Bleeding (had 1 episode of 2 days back to back rectal bleeding; seems to have followed COVID symptoms; no recurrent episodes; now drinking more water etc. no changes in bowels etc. No abdominal pain)   Subjective  HPI:  This is a very pleasant 49 year old woman referred by primary care after recent telemedicine appointment when the patient describes some rectal bleeding.  She had had an episode of acute constipation with the need to strain and firm stool.  This led to 2 episodes of rectal bleeding on successive days, none since then.  It occurred about 3 weeks ago, soon after she had symptoms or respiratory infection.  She had a close Covid contact, but she herself tested negative. Tammy Brock denies chronic abdominal pain, chronic constipation, nausea, vomiting, early satiety or weight loss.  She has occasional heartburn with offending foods, responds well to as needed antacid.  Denies dysphagia or odynophagia.  Bowel habits regular lately trying to get more fiber and water and activity.  ROS:  Review of Systems Denies chest pain dyspnea or dysuria  Past Medical History: Past Medical History:  Diagnosis Date  . Anemia   . Hypertension    maintained on exforge     Past Surgical History: Past Surgical History:  Procedure Laterality Date  . CESAREAN SECTION    . DILATION AND CURETTAGE OF UTERUS  07/2008   ablation  . ganglion cyst removal Left    wrist  . LAPAROSCOPIC SUPRACERVICAL HYSTERECTOMY  10/08/2011   Procedure: LAPAROSCOPIC SUPRACERVICAL HYSTERECTOMY;  Surgeon: Clarene Duke, MD;  Location: [facility];  Service: Gynecology;  Laterality: N/A;     Family History: Family History  Problem Relation Age of Onset  . Hypertension Mother   . Leukemia Mother   . Hypertension Father   . Hypertension Sister     . Hypertension Brother   . Hypertension Sister   . Colon cancer Neg Hx   . Esophageal cancer Neg Hx   . Liver disease Neg Hx   . Pancreatic cancer Neg Hx   . Stomach cancer Neg Hx     Social History: Social History   Socioeconomic History  . Marital status: Married    Spouse name: Not on file  . Number of children: Not on file  . Years of education: Not on file  . Highest education level: Not on file  Occupational History  . Occupation: Educator  Tobacco Use  . Smoking status: Never Smoker  . Smokeless tobacco: Never Used  Substance and Sexual Activity  . Alcohol use: No  . Drug use: No  . Sexual activity: Not on file  Other Topics Concern  . Not on file  Social History Narrative  . Not on file   Social Determinants of Health   Financial Resource Strain:   . Difficulty of Paying Living Expenses: Not on file  Food Insecurity:   . Worried About Charity fundraiser in the Last Year: Not on file  . Ran Out of Food in the Last Year: Not on file  Transportation Needs:   . Lack of Transportation (Medical): Not on file  . Lack of Transportation (Non-Medical): Not on file  Physical Activity:   . Days of Exercise per Week: Not on file  . Minutes of Exercise per Session: Not on file  Stress:   . Feeling of Stress : Not  on file  Social Connections:   . Frequency of Communication with Friends and Family: Not on file  . Frequency of Social Gatherings with Friends and Family: Not on file  . Attends Religious Services: Not on file  . Active Member of Clubs or Organizations: Not on file  . Attends Archivist Meetings: Not on file  . 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 amLODIPine-Valsartan-HCTZ 10-160-12.5 MG TABS Take 1 tablet by mouth daily. 90 tablet 3   No current facility-administered  medications for this visit.      ___________________________________________________________________ Objective   Exam:  BP 140/80   Pulse 88   Temp 97.6 F (36.4 C)   Ht 5' 4.5" (1.638 m)   Wt 197 lb 12.8 oz (89.7 kg)   LMP 09/21/2011   BMI 33.43 kg/m    General: Well-appearing  Eyes: sclera anicteric, no redness  ENT: oral mucosa moist without lesions, no cervical or supraclavicular lymphadenopathy  CV: RRR without murmur, S1/S2, no JVD, no peripheral edema  Resp: clear to auscultation bilaterally, normal RR and effort noted  GI: soft, no tenderness, with active bowel sounds. No guarding or palpable organomegaly noted.  Skin; warm and dry, no rash or jaundice noted  Neuro: awake, alert and oriented x 3. Normal gross motor function and fluent speech Rectal exam deferred Labs:  CBC Latest Ref Rng & Units 10/08/2019 02/09/2018 10/08/2011  WBC 3.4 - 10.8 x10E3/uL 6.9 6.2 5.2  Hemoglobin 11.1 - 15.9 g/dL 13.1 11.9 11.2(L)  Hematocrit 34.0 - 46.6 % 39.7 37.3 34.5(L)  Platelets 150 - 450 x10E3/uL 316 366 404(H)    Assessment: Encounter Diagnoses  Name Primary?  . Rectal bleeding Yes  . Special screening for malignant neoplasms, colon     Self-limited benign sounding rectal bleeding during episode of constipation. Bowel habits are now regular.  She needs a screening colonoscopy and is at average risk. Plan:  She is agreeable to a colonoscopy after discussion of procedure and risks.  The benefits and risks of the planned procedure were described in detail with the patient or (when appropriate) their health care proxy.  Risks were outlined as including, but not limited to, bleeding, infection, perforation, adverse medication reaction leading to cardiac or pulmonary decompensation, pancreatitis (if ERCP).  The limitation of incomplete mucosal visualization was also discussed.  No guarantees or warranties were given.   Thank you for the courtesy of this consult.   Please call me with any questions or concerns.  Nelida Meuse III  CC: Referring provider noted above

## 2019-11-06 ENCOUNTER — Other Ambulatory Visit: Payer: Self-pay

## 2019-11-06 ENCOUNTER — Encounter: Payer: Self-pay | Admitting: Family Medicine

## 2019-11-06 ENCOUNTER — Ambulatory Visit: Payer: BC Managed Care – PPO | Admitting: Family Medicine

## 2019-11-06 VITALS — BP 142/80 | HR 74 | Temp 98.2°F | Ht 64.0 in | Wt 197.4 lb

## 2019-11-06 DIAGNOSIS — H6123 Impacted cerumen, bilateral: Secondary | ICD-10-CM | POA: Diagnosis not present

## 2019-11-06 NOTE — Patient Instructions (Addendum)
  As per our discussion, I recommend that every 3 to 6 months you irrigate your ears prophylactically.  Use one of the over-the-counter eardrops for softening earwax.  If you stay ahead on this, you can probably avoid having to go to doctors to get the wax irrigated.  Return if problems arise.   If you have lab work done today you will be contacted with your lab results within the next 2 weeks.  If you have not heard from [place] then please contact us. The fastest way to get your results is to register for My Chart.   IF you received an x-ray today, you will receive an invoice from [facility]. Please contact [facility] at [phone] with questions or concerns regarding your invoice.   IF you received labwork today, you will receive an invoice from [facility]. Please contact [facility] at [phone] with questions or concerns regarding your invoice.   Our billing staff will not be able to assist you with questions regarding bills from these companies.  You will be contacted with the lab results as soon as they are available. The fastest way to get your results is to activate your My Chart account. Instructions are located on the last page of this paperwork. If you have not heard from [place] regarding the results in 2 weeks, please contact this office.

## 2019-11-06 NOTE — Progress Notes (Signed)
Patient ID: Tammy Brock, female    DOB: 1971-10-16  Age: 49 y.o. MRN: [id number]  Chief Complaint  Patient presents with  . Cerumen Impaction    Bilateral.   . left ear pain    Subjective:   Friday the patient developed loss of hearing in her left ear.  She has had episodes in the past every few years where she has had to have cerumen irrigated.  She tried getting some stuff at the drugstore and using it, but can get it to flush out.  Current allergies, medications, problem list, past/family and social histories reviewed.  Objective:  BP (!) 142/80   Pulse 74   Temp 98.2 F (36.8 C) (Temporal)   Ht 5\' 4"  (1.626 m)   Wt 197 lb 6.4 oz (89.5 kg)   LMP 09/21/2011   BMI 33.88 kg/m   No acute distress.  Left ear canal completely occluded by wax.  Right is two thirds full, but not fully plugged yet.  Assessment & Plan:   Assessment: 1. Bilateral impacted cerumen       Plan: Cerumen impaction will be irrigated out today.  No orders of the defined types were placed in this encounter.   No orders of the defined types were placed in this encounter.        Patient Instructions    As per our discussion, I recommend that every 3 to 6 months you irrigate your ears prophylactically.  Use one of the over-the-counter eardrops for softening earwax.  If you stay ahead on this, you can probably avoid having to go to doctors to get the wax irrigated.  Return if problems arise.   If you have lab work done today you will be contacted with your lab results within the next 2 weeks.  If you have not heard from [place] then please contact us. The fastest way to get your results is to register for My Chart.   IF you received an x-ray today, you will receive an invoice from [facility]. Please contact [facility] at [phone] with questions or concerns regarding your invoice.   IF you received labwork today, you will receive an invoice from [facility]. Please contact  [facility] at [phone] with questions or concerns regarding your invoice.   Our billing staff will not be able to assist you with questions regarding bills from these companies.  You will be contacted with the lab results as soon as they are available. The fastest way to get your results is to activate your My Chart account. Instructions are located on the last page of this paperwork. If you have not heard from [place] regarding the results in 2 weeks, please contact this office.         Return if symptoms worsen or fail to improve.   Ruben Reason, MD 11/06/2019

## 2019-11-15 ENCOUNTER — Other Ambulatory Visit: Payer: Self-pay | Admitting: Gastroenterology

## 2019-11-16 ENCOUNTER — Telehealth: Payer: Self-pay | Admitting: Gastroenterology

## 2019-11-16 LAB — SARS CORONAVIRUS 2 (TAT 6-24 HRS): SARS Coronavirus 2: NEGATIVE

## 2019-11-16 NOTE — Telephone Encounter (Signed)
Pt is scheduled for a colonoscopy tomorrow and reported that she had eaten "a whole lot of red Jello."

## 2019-11-16 NOTE — Telephone Encounter (Signed)
Advised pt not to eat any more red jello and that she is ok to proceed with procedure.

## 2019-11-17 ENCOUNTER — Encounter: Payer: Self-pay | Admitting: Gastroenterology

## 2019-11-17 ENCOUNTER — Other Ambulatory Visit: Payer: Self-pay

## 2019-11-17 ENCOUNTER — Ambulatory Visit (AMBULATORY_SURGERY_CENTER): Payer: BC Managed Care – PPO | Admitting: Gastroenterology

## 2019-11-17 VITALS — BP 131/78 | HR 73 | Temp 96.8°F | Resp 14 | Ht 64.0 in | Wt 197.0 lb

## 2019-11-17 DIAGNOSIS — Z1211 Encounter for screening for malignant neoplasm of colon: Secondary | ICD-10-CM

## 2019-11-17 MED ORDER — SODIUM CHLORIDE 0.9 % IV SOLN: 500.0000 mL | Freq: Once | INTRAVENOUS | Status: DC

## 2019-11-17 NOTE — Progress Notes (Signed)
Pt's states no medical or surgical changes since previsit or office visit.  Temp- June Vitals- Courtney 

## 2019-11-17 NOTE — Op Note (Signed)
[facility] Patient Name: Tammy Brock Procedure Date: 11/17/2019 11:21 AM MRN: [id number] Endoscopist: Organ. Loletha Carrow , MD Age: 49 Referring MD:  Date of Birth: [birth year] Gender: Female Account #: [id number] Procedure:                Colonoscopy Indications:              Screening for colorectal malignant neoplasm, This                            is the patient's first colonoscopy Medicines:                Monitored Anesthesia Care Procedure:                Pre-Anesthesia Assessment:                           - Prior to the procedure, a History and Physical                            was performed, and patient medications and                            allergies were reviewed. The patient's tolerance of                            previous anesthesia was also reviewed. The risks                            and benefits of the procedure and the sedation                            options and risks were discussed with the patient.                            All questions were answered, and informed consent                            was obtained. Prior Anticoagulants: The patient has                            taken no previous anticoagulant or antiplatelet                            agents. ASA Grade Assessment: II - A patient with                            mild systemic disease. After reviewing the risks                            and benefits, the patient was deemed in                            satisfactory condition to undergo the procedure.  After obtaining informed consent, the colonoscope                            was passed under direct vision. Throughout the                            procedure, the patient's blood pressure, pulse, and                            oxygen saturations were monitored continuously. The                            Colonoscope was introduced through the anus and                            advanced to the the cecum,  identified by                            appendiceal orifice and ileocecal valve. The                            colonoscopy was performed without difficulty. The                            patient tolerated the procedure well. The quality                            of the bowel preparation was excellent. The                            ileocecal valve, appendiceal orifice, and rectum                            were photographed. Scope In: 11:33:32 AM Scope Out: 11:43:33 AM Scope Withdrawal Time: 0 hours 7 minutes 42 seconds  Total Procedure Duration: 0 hours 10 minutes 1 second  Findings:                 The perianal and digital rectal examinations were                            normal.                           A few diverticula were found in the sigmoid colon.                           The exam was otherwise without abnormality on                            direct and retroflexion views. Complications:            No immediate complications. Estimated Blood Loss:     Estimated blood loss: none. Impression:               - Diverticulosis in the sigmoid colon.                           -  The examination was otherwise normal on direct                            and retroflexion views.                           - No specimens collected. Recommendation:           - Patient has a contact number available for                            emergencies. The signs and symptoms of potential                            delayed complications were discussed with the                            patient. Return to normal activities tomorrow.                            Written discharge instructions were provided to the                            patient.                           - Resume previous diet.                           - Continue present medications.                           - Repeat colonoscopy in 10 years for screening                            purposes. Pyper Olexa L. Loletha Carrow, MD 11/17/2019  11:54:21 AM This report has been signed electronically.

## 2019-11-17 NOTE — Progress Notes (Signed)
Report given to PACU, vss 

## 2019-11-17 NOTE — Patient Instructions (Signed)
Diverticulosis. See handouts.     YOU HAD AN ENDOSCOPIC PROCEDURE TODAY AT [facility]:   Refer to the procedure report that was given to you for any specific questions about what was found during the examination.  If the procedure report does not answer your questions, please call your gastroenterologist to clarify.  If you requested that your care partner not be given the details of your procedure findings, then the procedure report has been included in a sealed envelope for you to review at your convenience later.  YOU SHOULD EXPECT: Some feelings of bloating in the abdomen. Passage of more gas than usual.  Walking can help get rid of the air that was put into your GI tract during the procedure and reduce the bloating. If you had a lower endoscopy (such as a colonoscopy or flexible sigmoidoscopy) you may notice spotting of blood in your stool or on the toilet paper. If you underwent a bowel prep for your procedure, you may not have a normal bowel movement for a few days.  Please Note:  You might notice some irritation and congestion in your nose or some drainage.  This is from the oxygen used during your procedure.  There is no need for concern and it should clear up in a day or so.  SYMPTOMS TO REPORT IMMEDIATELY:   Following lower endoscopy (colonoscopy or flexible sigmoidoscopy):  Excessive amounts of blood in the stool  Significant tenderness or worsening of abdominal pains  Swelling of the abdomen that is new, acute  Fever of 100F or higher   For urgent or emergent issues, a gastroenterologist can be reached at any hour by calling [phone].   DIET:  We do recommend a small meal at first, but then you may proceed to your regular diet.  Drink plenty of fluids but you should avoid alcoholic beverages for 24 hours.  ACTIVITY:  You should plan to take it easy for the rest of today and you should NOT DRIVE or use heavy machinery until tomorrow (because of the  sedation medicines used during the test).    FOLLOW UP: Our staff will call the number listed on your records 48-72 hours following your procedure to check on you and address any questions or concerns that you may have regarding the information given to you following your procedure. If we do not reach you, we will leave a message.  We will attempt to reach you two times.  During this call, we will ask if you have developed any symptoms of COVID 19. If you develop any symptoms (ie: fever, flu-like symptoms, shortness of breath, cough etc.) before then, please call [phone].  If you test positive for Covid 19 in the 2 weeks post procedure, please call and report this information to [place].    If any biopsies were taken you will be contacted by phone or by letter within the next 1-3 weeks.  Please call us at [phone] if you have not heard about the biopsies in 3 weeks.    SIGNATURES/CONFIDENTIALITY: You and/or your care partner have signed paperwork which will be entered into your electronic medical record.  These signatures attest to the fact that that the information above on your After Visit Summary has been reviewed and is understood.  Full responsibility of the confidentiality of this discharge information lies with you and/or your care-partner.

## 2019-11-21 ENCOUNTER — Telehealth: Payer: Self-pay

## 2019-11-21 NOTE — Telephone Encounter (Signed)
  Follow up Call-  Call back number 11/17/2019  Post procedure Call Back phone  # [phone]  Permission to leave phone message Yes  Some recent data might be hidden     Patient questions:  Do you have a fever, pain , or abdominal swelling? No. Pain Score  0 *  Have you tolerated food without any problems? Yes.    Have you been able to return to your normal activities? Yes.    Do you have any questions about your discharge instructions: Diet   No. Medications  No. Follow up visit  No.  Do you have questions or concerns about your Care? No.  Actions: * If pain score is 4 or above: No action needed, pain <4.  Have you developed a fever since your procedure? No 2.   Have you had an respiratory symptoms (SOB or cough) since your procedure? No  3.   Have you tested positive for COVID 19 since your procedure No  4.   Have you had any family members/close contacts diagnosed with the COVID 19 since your procedure?  No   If yes to any of these questions please route to Joylene John, RN and Alphonsa Gin, RN.

## 2019-12-11 ENCOUNTER — Other Ambulatory Visit: Payer: Self-pay

## 2019-12-11 ENCOUNTER — Ambulatory Visit: Payer: BC Managed Care – PPO

## 2019-12-11 DIAGNOSIS — Z20822 Contact with and (suspected) exposure to covid-19: Secondary | ICD-10-CM

## 2019-12-12 LAB — SAR COV2 SEROLOGY (COVID19)AB(IGG),IA: DiaSorin SARS-CoV-2 Ab, IgG: POSITIVE

## 2020-01-15 ENCOUNTER — Ambulatory Visit: Payer: BC Managed Care – PPO | Admitting: Registered Nurse

## 2020-01-16 ENCOUNTER — Encounter: Payer: Self-pay | Admitting: Family Medicine

## 2020-01-17 ENCOUNTER — Encounter: Payer: Self-pay | Admitting: Registered Nurse

## 2020-01-26 ENCOUNTER — Other Ambulatory Visit: Payer: Self-pay

## 2020-01-26 ENCOUNTER — Ambulatory Visit: Payer: BC Managed Care – PPO | Admitting: Physician Assistant

## 2020-01-26 ENCOUNTER — Encounter: Payer: Self-pay | Admitting: Physician Assistant

## 2020-01-26 DIAGNOSIS — L918 Other hypertrophic disorders of the skin: Secondary | ICD-10-CM | POA: Diagnosis not present

## 2020-01-26 NOTE — Progress Notes (Addendum)
   Follow-Up Visit   Subjective  Tammy Brock is a 49 y.o. female who presents for the following: Skin Tag (has multiple skin tags that she would like removed; temples, corner of mouth, elbow crease and groin.).    The following portions of the chart were reviewed this encounter and updated as appropriate: Tobacco  Allergies  Meds  Problems  Med Hx  Surg Hx  Fam Hx  Soc Hx      Objective  Well appearing patient in no apparent distress; mood and affect are within normal limits.  A focused examination was performed including face, inguinal fold. Relevant physical exam findings are noted in the Assessment and Plan.  Objective  Left Oral Commissure, Left Thigh - Anterior, Right Forehead, Right Lower Vermilion Lip , Right Temple (2), Right Thigh - Anterior: Fleshy, skin-colored sessile and pedunculated papules.    Images    Assessment & Plan  Skin tags, multiple acquired (7) Left Thigh - Anterior; Right Thigh - Anterior; Right Temple (2); Left Oral Commissure; Right Lower Vermilion Lip ; Right Forehead  Removed with scissors without anesthesia. Hemostasis was attained with Aluminum chloride. Pt. Tolerated procedure well.

## 2020-03-01 NOTE — Addendum Note (Signed)
Addended by: Robyne Askew R on: 03/01/2020 01:44 PM   Modules accepted: Level of Service

## 2020-07-16 IMAGING — US US THYROID
1 series · 14 of 25 positions shown · non-contrast
Comparison: None.

CLINICAL DATA: Thyromegaly

EXAM:
THYROID ULTRASOUND
TECHNIQUE: Ultrasound examination of the thyroid gland and adjacent soft
tissues was performed.

[Series 1: us thyroid · 0.07mm/px · 14 of 41 slices shown]
[im 1/41]
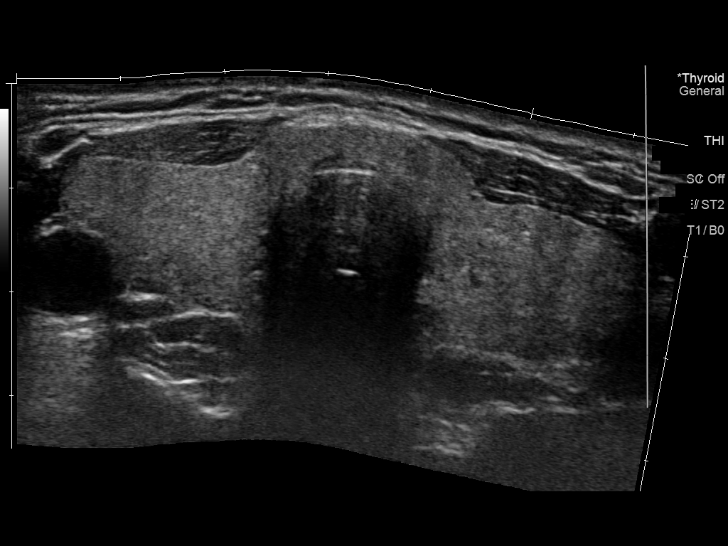
[im 4/41]
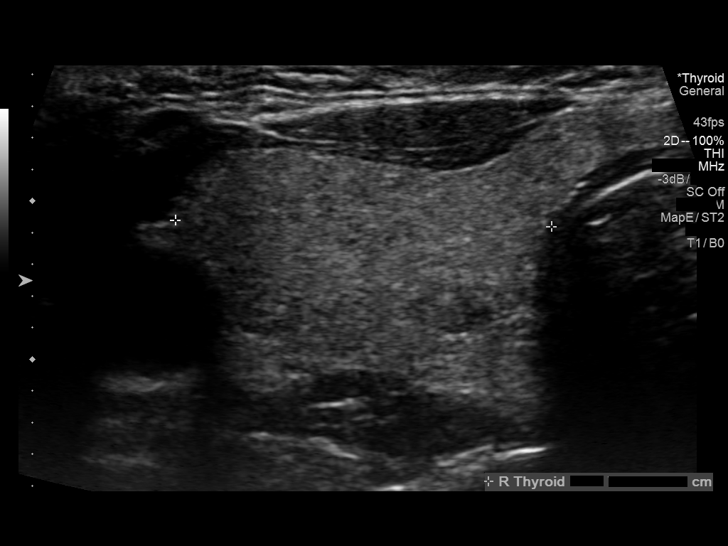
[im 7/41]
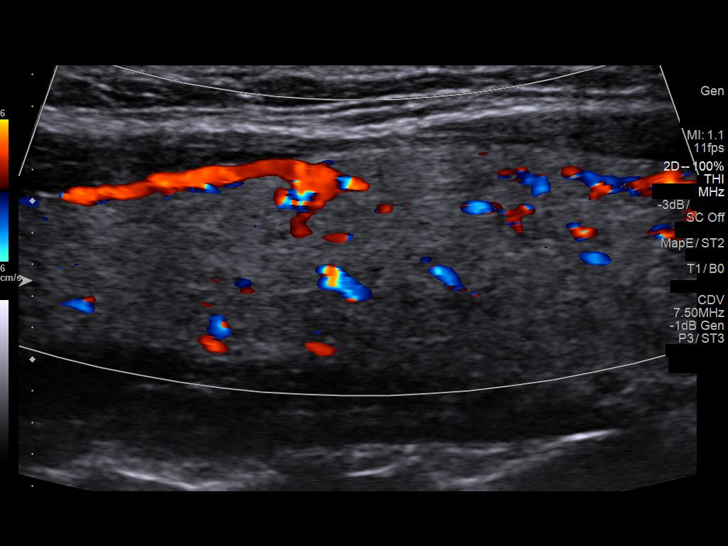
[im 11/41]
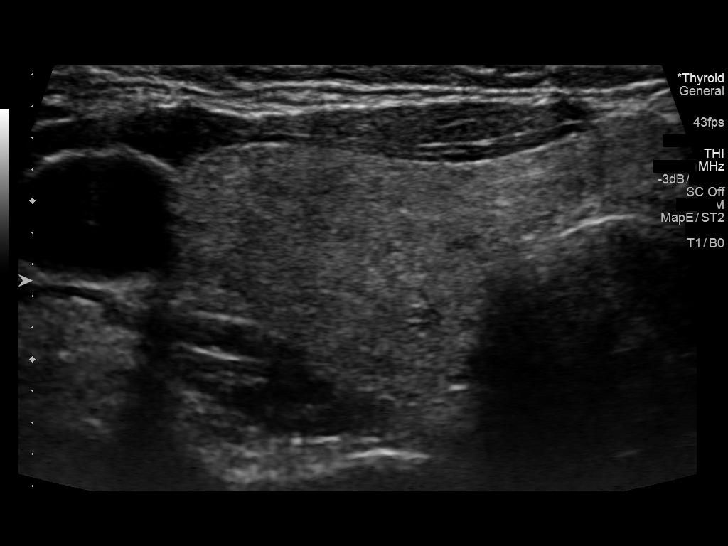
[im 14/41]
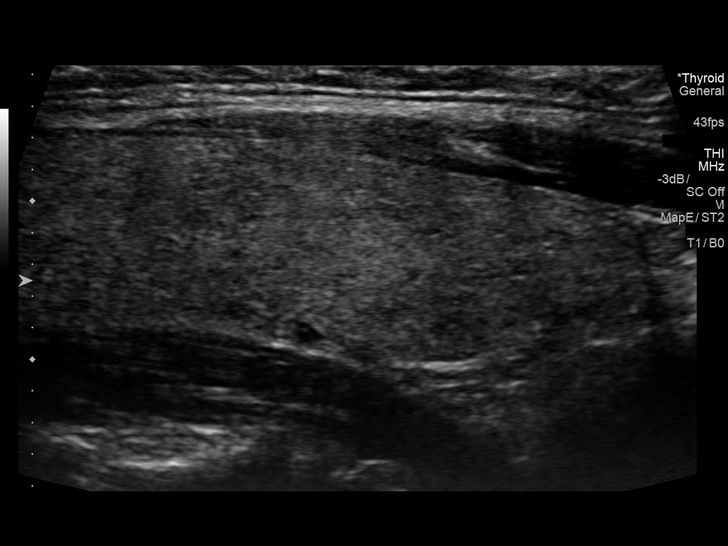
[im 16/41]
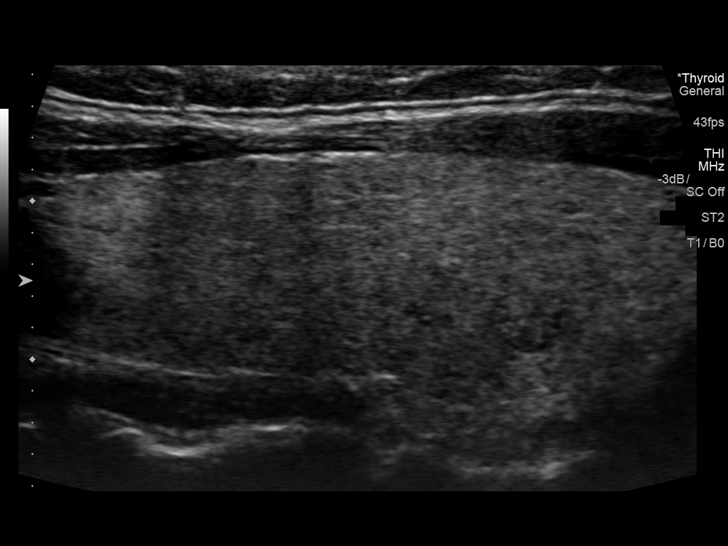
[im 19/41]
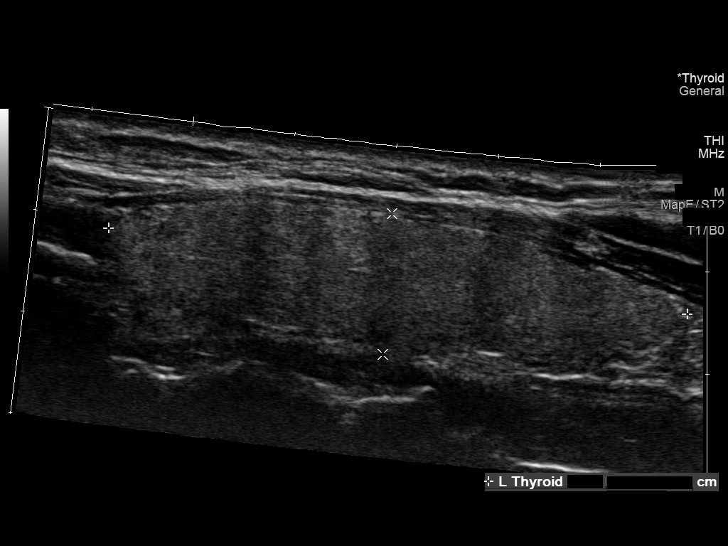
[im 22/41]
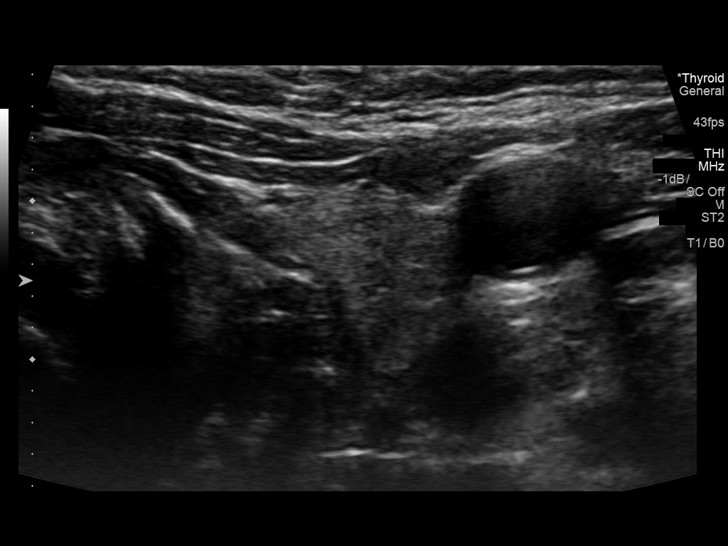
[im 26/41]
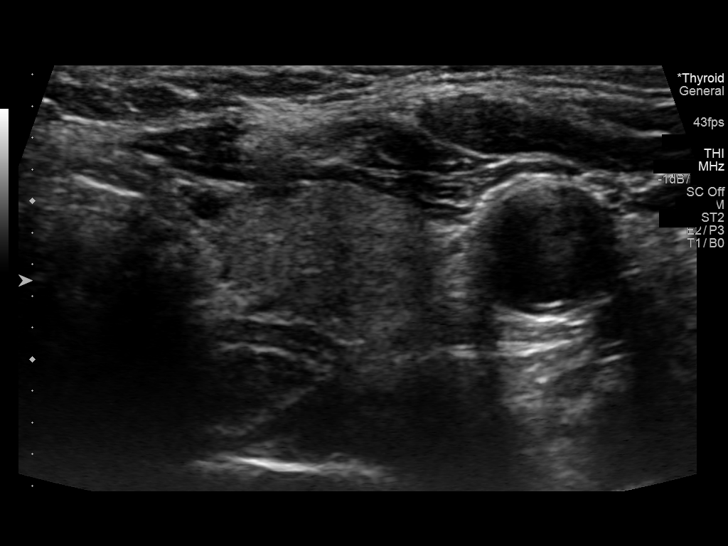
[im 27/41]
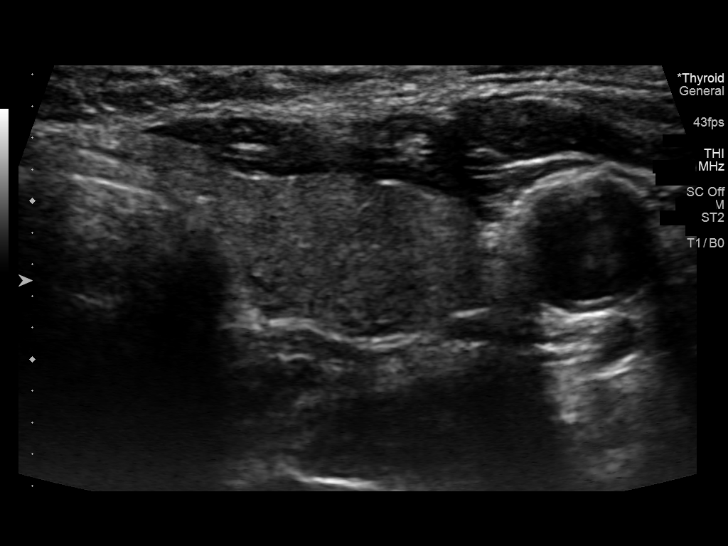
[im 31/41]
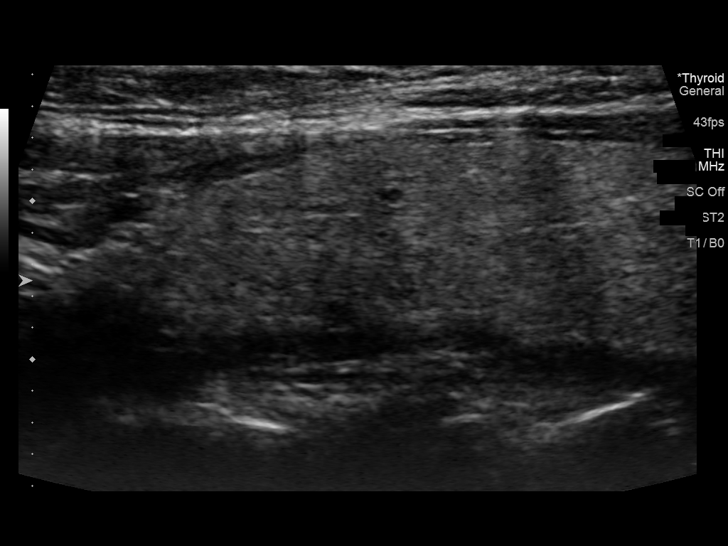
[im 34/41]
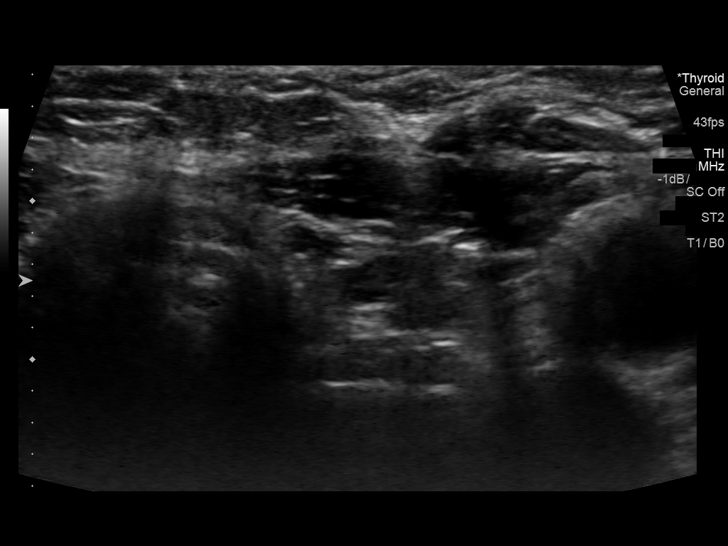
[im 37/41]
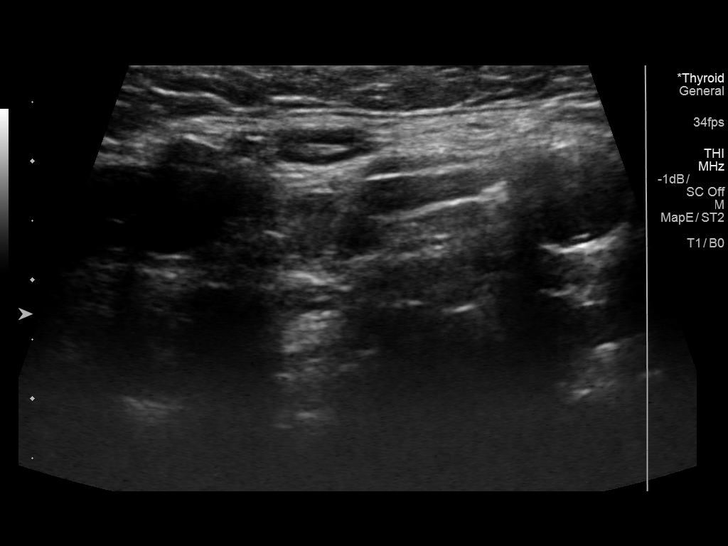
[im 41/41]
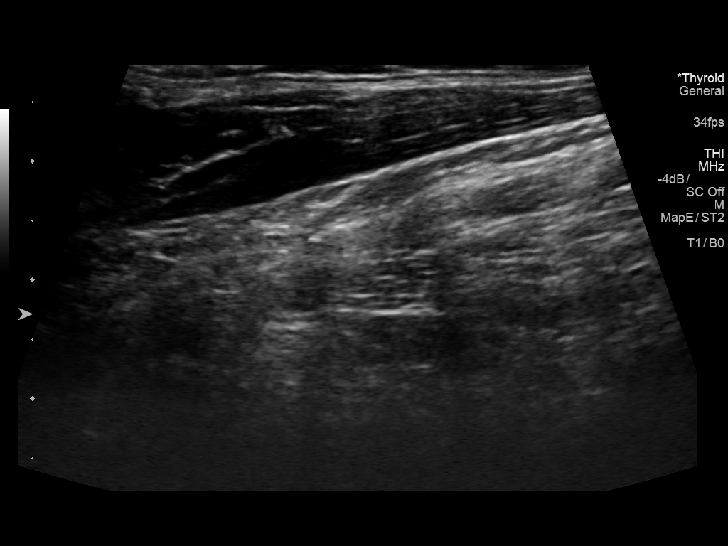

[14 of 25 positions shown; findings below may reference images not displayed]

FINDINGS: Parenchymal Echotexture: Mildly heterogenous

Isthmus: 0.4 cm thickness

Right lobe: 6 x 1.4 x 2.4 cm

Left lobe: 5.7 x 1.4 x 2.1 cm

_________________________________________________________

Estimated total number of nodules >/= 1 cm: 0

Number of spongiform nodules >/=  2 cm not described below (TR1): 0

Number of mixed cystic and solid nodules >/= 1.5 cm not described
below (TR2): 0

_________________________________________________________

No discrete nodules are seen within the thyroid gland.
IMPRESSION: 1. Mild thyromegaly with heterogenous parenchyma.  No nodule.

The above is in keeping with the ACR TI-RADS recommendations - [HOSPITAL] 8933;[DATE].

## 2020-09-24 ENCOUNTER — Other Ambulatory Visit: Payer: Self-pay

## 2020-09-24 ENCOUNTER — Ambulatory Visit
Admission: EM | Admit: 2020-09-24 | Discharge: 2020-09-24 | Disposition: A | Discharge status: Home / Self Care | Payer: BC Managed Care – PPO | Attending: Family Medicine | Admitting: Family Medicine

## 2020-09-24 ENCOUNTER — Encounter: Payer: Self-pay | Admitting: Emergency Medicine

## 2020-09-24 DIAGNOSIS — L219 Seborrheic dermatitis, unspecified: Secondary | ICD-10-CM

## 2020-09-24 DIAGNOSIS — L42 Pityriasis rosea: Secondary | ICD-10-CM

## 2020-09-24 MED ORDER — KETOCONAZOLE 2 % EX CREA: 1.0000 "application " | TOPICAL_CREAM | Freq: Two times a day (BID) | CUTANEOUS | 0 refills | Status: DC

## 2020-09-24 MED ORDER — KETOCONAZOLE 1 % EX SHAM: 1.0000 "application " | MEDICATED_SHAMPOO | CUTANEOUS | 1 refills | Status: DC

## 2020-09-24 NOTE — ED Triage Notes (Signed)
Pt here for rash to side and in between breasts that she thinks could be ring worm

## 2020-09-24 NOTE — ED Provider Notes (Signed)
[facility]    CSN: [id number] Arrival date & time: 09/24/20  0931      History   Chief Complaint Chief Complaint  Patient presents with  . Rash    HPI Tammy Brock is a 49 y.o. female.   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 She has developed a rash on her right hip that started over a month ago and is oval in shape and scaly. Subsequently she developed 1 between her breasts and on her left abdomen.  She is also had some recent scaling in her scalp.     Past Medical History:  Diagnosis Date  . Anemia   . Hypertension    maintained on exforge    Patient Active Problem List   Diagnosis Date Noted  . Bilateral impacted cerumen 11/06/2019  . Fibroid uterus 10/08/2011    Past Surgical History:  Procedure Laterality Date  . CESAREAN SECTION    . DILATION AND CURETTAGE OF UTERUS  07/2008   ablation  . ganglion cyst removal Left    wrist  . LAPAROSCOPIC SUPRACERVICAL HYSTERECTOMY  10/08/2011   Procedure: LAPAROSCOPIC SUPRACERVICAL HYSTERECTOMY;  Surgeon: Clarene Duke, MD;  Location: [facility];  Service: Gynecology;  Laterality: N/A;    OB History   No obstetric history on file.      Home Medications    Prior to Admission medications   Medication Sig Start Date End Date Taking? Authorizing Provider  amLODIPine-Valsartan-HCTZ 10-160-12.5 MG TABS Take 1 tablet by mouth daily. 04/17/19   Forrest Moron, MD  ketoconazole (NIZORAL) 2 % cream Apply 1 application topically 2 (two) times daily. 09/24/20   Robyn Haber, MD  KETOCONAZOLE, TOPICAL, 1 % SHAM Apply 1 application topically once a week. 09/24/20   Robyn Haber, MD    Family History Family History  Problem Relation Age of Onset  . Hypertension Mother   . Leukemia Mother   . Hypertension Father   . Hypertension Sister   . Hypertension Brother   . Hypertension Sister   . Colon cancer Neg Hx   . Esophageal cancer Neg Hx   . Liver disease  Neg Hx   . Pancreatic cancer Neg Hx   . Stomach cancer Neg Hx     Social History Social History   Tobacco Use  . Smoking status: Never Smoker  . Smokeless tobacco: Never Used  Vaping Use  . Vaping Use: Never used  Substance Use Topics  . Alcohol use: No  . Drug use: No     Allergies   Patient has no known allergies.   Review of Systems Review of Systems   Physical Exam Triage Vital Signs ED Triage Vitals  Enc Vitals Group     BP 09/24/20 0952 (!) 193/86     Pulse Rate 09/24/20 0952 84     Resp 09/24/20 0952 18     Temp 09/24/20 0952 98.2 F (36.8 C)     Temp Source 09/24/20 0952 Oral     SpO2 09/24/20 0952 96 %     Weight --      Height --      Head Circumference --      Peak Flow --      Pain Score 09/24/20 0953 0     Pain Loc --      Pain Edu? --      Excl. in [place]? --    No data found.  Updated Vital Signs BP Marland Kitchen)  193/86 (BP Location: Left Arm)   Pulse 84   Temp 98.2 F (36.8 C) (Oral)   Resp 18   LMP 09/21/2011   SpO2 96%    Physical Exam Vitals and nursing note reviewed.  Constitutional:      Appearance: Normal appearance. She is obese. She is not ill-appearing.  HENT:     Head: Normocephalic.  Eyes:     Conjunctiva/sclera: Conjunctivae normal.  Pulmonary:     Effort: Pulmonary effort is normal.  Musculoskeletal:        General: Normal range of motion.     Cervical back: Normal range of motion and neck supple.  Skin:    General: Skin is warm and dry.     Findings: Rash present.  Neurological:     General: No focal deficit present.     Mental Status: She is alert.  Psychiatric:        Mood and Affect: Mood normal.        UC Treatments / Results  Labs (all labs ordered are listed, but only abnormal results are displayed) Labs Reviewed - No data to display  EKG   Radiology No results found.  Procedures Procedures (including critical care time)  Medications Ordered in UC Medications - No data to display  Initial  Impression / Assessment and Plan / UC Course  I have reviewed the triage vital signs and the nursing notes.  Pertinent labs & imaging results that were available during my care of the patient were reviewed by me and considered in my medical decision making (see chart for details).    Final Clinical Impressions(s) / UC Diagnoses   Final diagnoses:  Pityriasis rosea  Seborrhea   Discharge Instructions   None    ED Prescriptions    Medication Sig Dispense Auth. Provider   ketoconazole (NIZORAL) 2 % cream Apply 1 application topically 2 (two) times daily. 30 g Robyn Haber, MD   KETOCONAZOLE, TOPICAL, 1 % SHAM Apply 1 application topically once a week. 125 mL Robyn Haber, MD     I have reviewed the PDMP during this encounter.   Robyn Haber, MD 09/24/20 1018

## 2020-09-24 NOTE — Discharge Instructions (Signed)
I believe the rash is pityriasis but the one on the hip has been there long enough to consider ringworm

## 2021-07-24 ENCOUNTER — Ambulatory Visit
Admission: EM | Admit: 2021-07-24 | Discharge: 2021-07-24 | Disposition: A | Discharge status: Home / Self Care | Payer: BC Managed Care – PPO | Attending: Urgent Care | Admitting: Urgent Care

## 2021-07-24 ENCOUNTER — Other Ambulatory Visit: Payer: Self-pay

## 2021-07-24 DIAGNOSIS — J069 Acute upper respiratory infection, unspecified: Secondary | ICD-10-CM

## 2021-07-24 DIAGNOSIS — H6983 Other specified disorders of Eustachian tube, bilateral: Secondary | ICD-10-CM

## 2021-07-24 DIAGNOSIS — H6993 Unspecified Eustachian tube disorder, bilateral: Secondary | ICD-10-CM

## 2021-07-24 DIAGNOSIS — H938X3 Other specified disorders of ear, bilateral: Secondary | ICD-10-CM

## 2021-07-24 MED ORDER — PSEUDOEPHEDRINE HCL 30 MG PO TABS: 30.0000 mg | ORAL_TABLET | Freq: Three times a day (TID) | ORAL | 0 refills | Status: AC | PRN

## 2021-07-24 MED ORDER — FLUTICASONE PROPIONATE 50 MCG/ACT NA SUSP: 2.0000 | Freq: Every day | NASAL | 12 refills | Status: AC

## 2021-07-24 MED ORDER — CETIRIZINE HCL 10 MG PO TABS: 10.0000 mg | ORAL_TABLET | Freq: Every day | ORAL | 0 refills | Status: AC

## 2021-07-24 NOTE — ED Provider Notes (Signed)
[facility]   MRN: [id number] DOB: Apr 13, 1971  Subjective:   Tammy Brock is a 50 y.o. female presenting for 4 to 5-day history of acute onset persistent bilateral ear pressure and popping, mild dizziness.  Started to have slight shortness of breath this morning.  No fever, sinus pain, ear pain, ear drainage, throat pain, cough, chest pain, shortness of breath, wheezing, nausea, vomiting, abdominal pain.  Does have a history of hypertension and has not been as compliant with her medications recently.  She has been traveling and thinks that this affected her ears as well.  She has done a COVID test at home and has been negative.  Does not want a repeat.  Patient is non-smoker.  No history of CHF, respiratory disorders.  No current facility-administered medications for this encounter.  Current Outpatient Medications:    amLODIPine-Valsartan-HCTZ 10-160-12.5 MG TABS, Take 1 tablet by mouth daily., Disp: 90 tablet, Rfl: 3   ketoconazole (NIZORAL) 2 % cream, Apply 1 application topically 2 (two) times daily., Disp: 30 g, Rfl: 0   KETOCONAZOLE, TOPICAL, 1 % SHAM, Apply 1 application topically once a week., Disp: 125 mL, Rfl: 1   No Known Allergies  Past Medical History:  Diagnosis Date   Anemia    Hypertension    maintained on exforge     Past Surgical History:  Procedure Laterality Date   CESAREAN SECTION     DILATION AND CURETTAGE OF UTERUS  07/2008   ablation   ganglion cyst removal Left    wrist   LAPAROSCOPIC SUPRACERVICAL HYSTERECTOMY  10/08/2011   Procedure: LAPAROSCOPIC SUPRACERVICAL HYSTERECTOMY;  Surgeon: Clarene Duke, MD;  Location: [facility];  Service: Gynecology;  Laterality: N/A;    Family History  Problem Relation Age of Onset   Hypertension Mother    Leukemia Mother    Hypertension Father    Hypertension Sister    Hypertension Brother    Hypertension Sister    Colon cancer Neg Hx    Esophageal cancer Neg Hx    Liver disease Neg Hx     Pancreatic cancer Neg Hx    Stomach cancer Neg Hx     Social History   Tobacco Use   Smoking status: Never   Smokeless tobacco: Never  Vaping Use   Vaping Use: Never used  Substance Use Topics   Alcohol use: No   Drug use: No    ROS   Objective:   Vitals: BP (!) 153/90 (BP Location: Right Arm)   Pulse 84   Temp 98 F (36.7 C) (Oral)   Resp 18   LMP 09/21/2011   SpO2 98%   Physical Exam Constitutional:      General: She is not in acute distress.    Appearance: Normal appearance. She is well-developed. She is not ill-appearing, toxic-appearing or diaphoretic.  HENT:     Head: Normocephalic and atraumatic.     Right Ear: Tympanic membrane, ear canal and external ear normal. No drainage or tenderness. No middle ear effusion. There is no impacted cerumen. Tympanic membrane is not erythematous.     Left Ear: Tympanic membrane, ear canal and external ear normal. No drainage or tenderness.  No middle ear effusion. There is no impacted cerumen. Tympanic membrane is not erythematous.     Nose: Nose normal. No congestion or rhinorrhea.     Mouth/Throat:     Mouth: Mucous membranes are moist. No oral lesions.     Pharynx: Oropharynx is clear. No pharyngeal swelling,  oropharyngeal exudate, posterior oropharyngeal erythema or uvula swelling.     Tonsils: No tonsillar exudate or tonsillar abscesses.  Eyes:     General: No scleral icterus.       Right eye: No discharge.        Left eye: No discharge.     Extraocular Movements: Extraocular movements intact.     Right eye: Normal extraocular motion.     Left eye: Normal extraocular motion.     Conjunctiva/sclera: Conjunctivae normal.     Pupils: Pupils are equal, round, and reactive to light.  Cardiovascular:     Rate and Rhythm: Normal rate and regular rhythm.     Pulses: Normal pulses.     Heart sounds: Normal heart sounds. No murmur heard.   No friction rub. No gallop.  Pulmonary:     Effort: Pulmonary effort is normal. No  respiratory distress.     Breath sounds: Normal breath sounds. No stridor. No wheezing, rhonchi or rales.  Musculoskeletal:     Cervical back: Normal range of motion and neck supple.  Lymphadenopathy:     Cervical: No cervical adenopathy.  Skin:    General: Skin is warm and dry.     Findings: No rash.  Neurological:     General: No focal deficit present.     Mental Status: She is alert and oriented to person, place, and time.  Psychiatric:        Mood and Affect: Mood normal.        Behavior: Behavior normal.        Thought Content: Thought content normal.        Judgment: Judgment normal.    Assessment and Plan :   PDMP not reviewed this encounter.  1. Viral URI with cough   2. Ear pressure, bilateral   3. Eustachian tube dysfunction, bilateral     Suspect viral URI, viral syndrome; physical exam findings reassuring and vital signs stable for discharge. Advised supportive care, offered symptomatic relief. Counseled patient on potential for adverse effects with medications prescribed/recommended today, ER and return-to-clinic precautions discussed, patient verbalized understanding.     Jaynee Eagles, PA-C 07/24/21 1627

## 2021-07-24 NOTE — ED Triage Notes (Signed)
Pt c/o bilateral ear pressure and popping since flying home on Sunday. States developed SOB this morning. No distress noted, speaking in complete sentences.

## 2021-07-25 ENCOUNTER — Ambulatory Visit: Payer: Self-pay

## 2021-11-20 NOTE — Progress Notes (Signed)
Erroneous encounter

## 2021-11-25 ENCOUNTER — Encounter: Payer: BC Managed Care – PPO | Admitting: Family

## 2021-11-25 DIAGNOSIS — Z7689 Persons encountering health services in other specified circumstances: Secondary | ICD-10-CM

## 2021-11-25 NOTE — Progress Notes (Deleted)
°  Subjective:    Tammy Brock - 51 y.o. female MRN [id number]  Date of birth: December 21, 1970  HPI  Tammy Brock is to establish care.   Current issues and/or concerns:  ROS per HPI     Health Maintenance:  Health Maintenance Due  Topic Date Due   COVID-19 Vaccine (1) Never done   Hepatitis C Screening  Never done   TETANUS/TDAP  Never done   Zoster Vaccines- Shingrix (1 of 2) Never done   PAP SMEAR-Modifier  Never done   MAMMOGRAM  Never done   INFLUENZA VACCINE  05/19/2021     Past Medical History: Patient Active Problem List   Diagnosis Date Noted   Bilateral impacted cerumen 11/06/2019   Fibroid uterus 10/08/2011      Social History   reports that she has never smoked. She has never used smokeless tobacco. She reports that she does not drink alcohol and does not use drugs.   Family History  family history includes Hypertension in her brother, father, mother, sister, and sister; Leukemia in her mother.   Medications: reviewed and updated   Objective:   Physical Exam LMP 09/21/2011  Physical Exam      Assessment & Plan:         Patient was given clear instructions to go to Emergency Department or return to medical center if symptoms don't improve, worsen, or new problems develop.The patient verbalized understanding.  I discussed the assessment and treatment plan with the patient. The patient was provided an opportunity to ask questions and all were answered. The patient agreed with the plan and demonstrated an understanding of the instructions.   The patient was advised to call back or seek an in-person evaluation if the symptoms worsen or if the condition fails to improve as anticipated.    Durene Fruits, NP 11/25/2021, 1:02 PM Primary Care at [facility]

## 2021-11-28 ENCOUNTER — Ambulatory Visit: Payer: BC Managed Care – PPO | Admitting: Family

## 2021-11-28 DIAGNOSIS — Z7689 Persons encountering health services in other specified circumstances: Secondary | ICD-10-CM

## 2021-12-17 ENCOUNTER — Other Ambulatory Visit: Payer: Self-pay

## 2021-12-17 ENCOUNTER — Ambulatory Visit (INDEPENDENT_AMBULATORY_CARE_PROVIDER_SITE_OTHER): Payer: BC Managed Care – PPO | Admitting: Nurse Practitioner

## 2021-12-17 ENCOUNTER — Encounter: Payer: Self-pay | Admitting: Nurse Practitioner

## 2021-12-17 VITALS — BP 153/88 | HR 83 | Resp 16 | Ht 65.5 in | Wt 191.0 lb

## 2021-12-17 DIAGNOSIS — I10 Essential (primary) hypertension: Secondary | ICD-10-CM

## 2021-12-17 DIAGNOSIS — N951 Menopausal and female climacteric states: Secondary | ICD-10-CM | POA: Diagnosis not present

## 2021-12-17 DIAGNOSIS — L219 Seborrheic dermatitis, unspecified: Secondary | ICD-10-CM | POA: Diagnosis not present

## 2021-12-17 MED ORDER — PAROXETINE MESYLATE 7.5 MG PO CAPS: 7.5000 mg | ORAL_CAPSULE | Freq: Every day | ORAL | 0 refills | Status: AC

## 2021-12-17 MED ORDER — AMLODIPINE-VALSARTAN-HCTZ 10-160-12.5 MG PO TABS: 1.0000 | ORAL_TABLET | Freq: Every day | ORAL | 0 refills | Status: DC

## 2021-12-17 MED ORDER — CICLOPIROX 1 % EX SHAM: 1.0000 "application " | MEDICATED_SHAMPOO | Freq: Every day | CUTANEOUS | 0 refills | Status: DC

## 2021-12-17 NOTE — Progress Notes (Signed)
@Patient  ID: Tammy Brock, female    DOB: [birth year], 51 y.o.   MRN: [id number] ? ?Chief Complaint  ?Patient presents with  ? Medication Management  ? ? ?Referring provider: ?Forrest Moron, MD ? ?HPI ? ?Patient presents today to establish care.  Patient states that she has a history of seborrheic dermatitis and has been seen by dermatology for this in the past.  She has used medicated shampoo for this previously would like a prescription called into the pharmacy.  Patient states that she is also having menopausal issues.  She states that she has severe hot flashes.  Her OB/GYN has started her on progesterone.  We discussed that we can trial Brisdelle.  Patient is due for complete physical.  We discussed that we will set her up a follow-up appointment to have this completed. Denies f/c/s, n/v/d, hemoptysis, PND, chest pain or edema. ? ?Note: Patient's blood pressure was elevated in the office today.  Patient stated that she forgot to take her blood pressure medications today.  She states that she does have these at home and she will take them as soon as she leaves the office today.  She states that she does need a refill on these medications today. ? ? ?No Known Allergies ? ? ?There is no immunization history on file for this patient. ? ?Past Medical History:  ?Diagnosis Date  ? Anemia   ? Hypertension   ? maintained on exforge  ? ? ?Tobacco History: ?Social History  ? ?Tobacco Use  ?Smoking Status Never  ?Smokeless Tobacco Never  ? ?Counseling given: Not Answered ? ? ?Outpatient Encounter Medications as of 12/17/2021  ?Medication Sig  ? cetirizine (ZYRTEC ALLERGY) 10 MG tablet Take 1 tablet (10 mg total) by mouth daily.  ? Ciclopirox 1 % shampoo Apply 1 application topically at bedtime.  ? fluticasone (FLONASE) 50 MCG/ACT nasal spray Place 2 sprays into both nostrils daily.  ? PARoxetine Mesylate 7.5 MG CAPS Take 7.5 mg by mouth at bedtime.  ? pseudoephedrine (SUDAFED) 30 MG tablet Take 1 tablet (30 mg total) by  mouth every 8 (eight) hours as needed for congestion.  ? [DISCONTINUED] amLODIPine-Valsartan-HCTZ 10-160-12.5 MG TABS Take 1 tablet by mouth daily.  ? [DISCONTINUED] ketoconazole (NIZORAL) 2 % cream Apply 1 application topically 2 (two) times daily.  ? [DISCONTINUED] KETOCONAZOLE, TOPICAL, 1 % SHAM Apply 1 application topically once a week.  ? amLODIPine-Valsartan-HCTZ 10-160-12.5 MG TABS Take 1 tablet by mouth daily.  ? norethindrone (AYGESTIN) 5 MG tablet Take 5 mg by mouth daily.  ? ?No facility-administered encounter medications on file as of 12/17/2021.  ? ? ? ?Review of Systems ? ?Review of Systems  ?Constitutional: Negative.   ?HENT: Negative.    ?Cardiovascular: Negative.   ?Gastrointestinal: Negative.   ?Skin:   ?     Flaky dry scalp  ?Allergic/Immunologic: Negative.   ?Neurological: Negative.   ?Psychiatric/Behavioral: Negative.     ? ? ? ?Physical Exam ? ?BP (!) 153/88   Pulse 83   Resp 16   Ht 5' 5.5" (1.664 m)   Wt 191 lb (86.6 kg)   LMP 09/21/2011   SpO2 99%   BMI 31.30 kg/m?  ? ?Wt Readings from Last 5 Encounters:  ?12/17/21 191 lb (86.6 kg)  ?11/17/19 197 lb (89.4 kg)  ?11/06/19 197 lb 6.4 oz (89.5 kg)  ?10/27/19 197 lb 12.8 oz (89.7 kg)  ?10/09/19 194 lb 6.4 oz (88.2 kg)  ? ? ? ?Physical Exam ?Vitals and nursing note  reviewed.  ?Constitutional:   ?   General: She is not in acute distress. ?   Appearance: She is well-developed.  ?Cardiovascular:  ?   Rate and Rhythm: Normal rate and regular rhythm.  ?Pulmonary:  ?   Effort: Pulmonary effort is normal.  ?   Breath sounds: Normal breath sounds.  ?Skin: ?   Comments: Dry scalp noted  ?Neurological:  ?   Mental Status: She is alert and oriented to person, place, and time.  ? ? ? ?Lab Results: ? ?CBC ?   ?Component Value Date/Time  ? WBC 6.9 10/08/2019 0859  ? WBC 5.2 10/08/2011 0817  ? RBC 4.33 10/08/2019 0859  ? RBC 3.79 (L) 10/08/2011 0817  ? HGB 13.1 10/08/2019 0859  ? HCT 39.7 10/08/2019 0859  ? PLT 316 10/08/2019 0859  ? MCV 92 10/08/2019 0859   ? MCH 30.3 10/08/2019 0859  ? MCH 29.6 10/08/2011 0817  ? MCHC 33.0 10/08/2019 0859  ? MCHC 32.5 10/08/2011 0817  ? RDW 12.8 10/08/2019 0859  ? LYMPHSABS 2.2 10/08/2019 0859  ? EOSABS 0.0 10/08/2019 0859  ? BASOSABS 0.0 10/08/2019 0859  ? ? ?BMET ?   ?Component Value Date/Time  ? NA 141 10/08/2019 0859  ? K 4.2 10/08/2019 0859  ? CL 105 10/08/2019 0859  ? CO2 23 10/08/2019 0859  ? GLUCOSE 87 10/08/2019 0859  ? GLUCOSE 94 10/08/2011 0817  ? BUN 14 10/08/2019 0859  ? CREATININE 0.64 10/08/2019 0859  ? CALCIUM 9.5 10/08/2019 0859  ? GFRNONAA 104 10/08/2019 0859  ? GFRAA 120 10/08/2019 0859  ? ? ?BNP ?No results found for: BNP ? ?ProBNP ?No results found for: PROBNP ? ?Imaging: ?No results found. ? ? ?Assessment & Plan:  ? ?Hypertension ?Low salt diet ? ?Stay active ? ?Will refill: ? ?- amLODIPine-Valsartan-HCTZ 10-160-12.5 MG TABS; Take 1 tablet by mouth daily.  Dispense: 90 tablet; Refill: 0 ? ?2. Seborrheic dermatitis of scalp ? ?- Ciclopirox 1 % shampoo; Apply 1 application topically at bedtime.  Dispense: 120 mL; Refill: 0 ? ?3. Hot flashes due to menopause ? ?- PARoxetine Mesylate 7.5 MG CAPS; Take 7.5 mg by mouth at bedtime.  Dispense: 30 capsule; Refill: 0 ? ?Follow up: ? ?Follow up in 2-4 weeks for complete physical with Amy or Dr. Redmond Pulling ? ? ? ? ?Fenton Foy, NP ?12/17/2021 ? ? ?

## 2021-12-17 NOTE — Progress Notes (Signed)
Request Ozempic and progesterone to help with hot flashes ? ?Shampoo for seborrheic dermatitis  ?

## 2021-12-17 NOTE — Assessment & Plan Note (Signed)
Low salt diet ? ?Stay active ? ?Will refill: ? ?- amLODIPine-Valsartan-HCTZ 10-160-12.5 MG TABS; Take 1 tablet by mouth daily.  Dispense: 90 tablet; Refill: 0 ? ?2. Seborrheic dermatitis of scalp ? ?- Ciclopirox 1 % shampoo; Apply 1 application topically at bedtime.  Dispense: 120 mL; Refill: 0 ? ?3. Hot flashes due to menopause ? ?- PARoxetine Mesylate 7.5 MG CAPS; Take 7.5 mg by mouth at bedtime.  Dispense: 30 capsule; Refill: 0 ? ?Follow up: ? ?Follow up in 2-4 weeks for complete physical with Amy or Dr. Redmond Pulling ?

## 2021-12-17 NOTE — Patient Instructions (Signed)
1. Essential hypertension ?Low salt diet ? ?Stay active ? ?Will refill: ? ?- amLODIPine-Valsartan-HCTZ 10-160-12.5 MG TABS; Take 1 tablet by mouth daily.  Dispense: 90 tablet; Refill: 0 ? ?2. Seborrheic dermatitis of scalp ? ?- Ciclopirox 1 % shampoo; Apply 1 application topically at bedtime.  Dispense: 120 mL; Refill: 0 ? ?3. Hot flashes due to menopause ? ?- PARoxetine Mesylate 7.5 MG CAPS; Take 7.5 mg by mouth at bedtime.  Dispense: 30 capsule; Refill: 0 ? ?Follow up: ? ?Follow up in 2-4 weeks for complete physical with Amy or Dr. Redmond Pulling ?

## 2022-01-13 ENCOUNTER — Ambulatory Visit: Payer: BC Managed Care – PPO | Admitting: Family

## 2022-01-29 ENCOUNTER — Encounter: Payer: Self-pay | Admitting: Family Medicine

## 2022-01-29 ENCOUNTER — Ambulatory Visit (INDEPENDENT_AMBULATORY_CARE_PROVIDER_SITE_OTHER): Payer: BC Managed Care – PPO | Admitting: Family Medicine

## 2022-01-29 VITALS — BP 138/88 | HR 84 | Temp 97.7°F | Resp 16 | Ht 66.0 in | Wt 191.0 lb

## 2022-01-29 DIAGNOSIS — Z0000 Encounter for general adult medical examination without abnormal findings: Secondary | ICD-10-CM

## 2022-01-29 DIAGNOSIS — Z1329 Encounter for screening for other suspected endocrine disorder: Secondary | ICD-10-CM | POA: Diagnosis not present

## 2022-01-29 DIAGNOSIS — Z13228 Encounter for screening for other metabolic disorders: Secondary | ICD-10-CM

## 2022-01-29 DIAGNOSIS — Z130 Encounter for screening for diseases of the blood and blood-forming organs and certain disorders involving the immune mechanism: Secondary | ICD-10-CM | POA: Diagnosis not present

## 2022-01-29 DIAGNOSIS — Z1159 Encounter for screening for other viral diseases: Secondary | ICD-10-CM

## 2022-01-29 DIAGNOSIS — Z13220 Encounter for screening for lipoid disorders: Secondary | ICD-10-CM

## 2022-01-29 NOTE — Progress Notes (Signed)
Patient is here for complete physical examination  ? ?Patient would like to talk with PCP about Ozempic . ? ?Patient has no other concerns ?

## 2022-01-29 NOTE — Progress Notes (Signed)
? ?Established Patient Office Visit ? ?Subjective:  ?Patient ID: Tammy Brock, female    DOB: August 11, 1971  Age: 51 y.o. MRN: [id number] ? ?CC:  ?Chief Complaint  ?Patient presents with  ? Annual Exam  ? ? ?HPI ?Tammy Brock presents for routine annual exam. Patient denies acute complaints or concerns. ? ?Past Medical History:  ?Diagnosis Date  ? Anemia   ? Hypertension   ? maintained on exforge  ? ? ?Past Surgical History:  ?Procedure Laterality Date  ? CESAREAN SECTION    ? DILATION AND CURETTAGE OF UTERUS  07/2008  ? ablation  ? ganglion cyst removal Left   ? wrist  ? LAPAROSCOPIC SUPRACERVICAL HYSTERECTOMY  10/08/2011  ? Procedure: LAPAROSCOPIC SUPRACERVICAL HYSTERECTOMY;  Surgeon: Clarene Duke, MD;  Location: [facility];  Service: Gynecology;  Laterality: N/A;  ? ? ?Family History  ?Problem Relation Age of Onset  ? Hypertension Mother   ? Leukemia Mother   ? Hypertension Father   ? Hypertension Sister   ? Hypertension Brother   ? Hypertension Sister   ? Colon cancer Neg Hx   ? Esophageal cancer Neg Hx   ? Liver disease Neg Hx   ? Pancreatic cancer Neg Hx   ? Stomach cancer Neg Hx   ? ? ?Social History  ? ?Socioeconomic History  ? Marital status: Married  ?  Spouse name: Not on file  ? Number of children: Not on file  ? Years of education: Not on file  ? Highest education level: Not on file  ?Occupational History  ? Occupation: Tourist information centre manager  ?Tobacco Use  ? Smoking status: Never  ? Smokeless tobacco: Never  ?Vaping Use  ? Vaping Use: Never used  ?Substance and Sexual Activity  ? Alcohol use: No  ? Drug use: No  ? Sexual activity: Not on file  ?Other Topics Concern  ? Not on file  ?Social History Narrative  ? Not on file  ? ?Social Determinants of Health  ? ?Financial Resource Strain: Not on file  ?Food Insecurity: Not on file  ?Transportation Needs: Not on file  ?Physical Activity: Not on file  ?Stress: Not on file  ?Social Connections: Not on file  ?Intimate Partner Violence: Not on file  ? ? ?ROS ?Review of  Systems  ?All other systems reviewed and are negative. ? ?Objective:  ? ?Today's Vitals: BP 138/88   Pulse 84   Temp 97.7 ?F (36.5 ?C) (Oral)   Resp 16   Ht $R'5\' 6"'zU$  (1.676 m)   Wt 191 lb (86.6 kg)   LMP 09/21/2011   SpO2 94%   BMI 30.83 kg/m?  ? ?Physical Exam ?Vitals and nursing note reviewed.  ?Constitutional:   ?   General: She is not in acute distress. ?HENT:  ?   Head: Normocephalic and atraumatic.  ?   Right Ear: Tympanic membrane, ear canal and external ear normal.  ?   Left Ear: Tympanic membrane, ear canal and external ear normal.  ?   Nose: Nose normal.  ?   Mouth/Throat:  ?   Mouth: Mucous membranes are moist.  ?   Pharynx: Oropharynx is clear.  ?Eyes:  ?   Conjunctiva/sclera: Conjunctivae normal.  ?   Pupils: Pupils are equal, round, and reactive to light.  ?Neck:  ?   Thyroid: No thyromegaly.  ?Cardiovascular:  ?   Rate and Rhythm: Normal rate and regular rhythm.  ?   Heart sounds: Normal heart sounds. No murmur heard. ?Pulmonary:  ?  Effort: Pulmonary effort is normal. No respiratory distress.  ?   Breath sounds: Normal breath sounds.  ?Abdominal:  ?   General: There is no distension.  ?   Palpations: Abdomen is soft. There is no mass.  ?   Tenderness: There is no abdominal tenderness.  ?Musculoskeletal:     ?   General: Normal range of motion.  ?   Cervical back: Normal range of motion and neck supple.  ?Skin: ?   General: Skin is warm and dry.  ?Neurological:  ?   General: No focal deficit present.  ?   Mental Status: She is alert and oriented to person, place, and time.  ?Psychiatric:     ?   Mood and Affect: Mood normal.     ?   Behavior: Behavior normal.  ? ? ?Assessment & Plan:  ? ?1. Annual physical exam ?Routine labs ordered.  ?- CMP14+EGFR ? ?2. Screening for deficiency anemia ? ?- CBC with Differential ? ?3. Screening for lipid disorders ? ?- Lipid Panel ? ?4. Screening for endocrine/metabolic/immunity disorders ? ?- Hemoglobin A1c ?- TSH ?- Vitamin D, 25-hydroxy ? ?5. Need for  hepatitis C screening test ? ? ?Outpatient Encounter Medications as of 01/29/2022  ?Medication Sig  ? amLODIPine-Valsartan-HCTZ 10-160-12.5 MG TABS Take 1 tablet by mouth daily.  ? cetirizine (ZYRTEC ALLERGY) 10 MG tablet Take 1 tablet (10 mg total) by mouth daily.  ? Ciclopirox 1 % shampoo Apply 1 application topically at bedtime.  ? fluticasone (FLONASE) 50 MCG/ACT nasal spray Place 2 sprays into both nostrils daily.  ? norethindrone (AYGESTIN) 5 MG tablet Take 5 mg by mouth daily.  ? pseudoephedrine (SUDAFED) 30 MG tablet Take 1 tablet (30 mg total) by mouth every 8 (eight) hours as needed for congestion.  ? ?No facility-administered encounter medications on file as of 01/29/2022.  ? ? ?Follow-up: No follow-ups on file.  ? ?Becky Sax, MD ? ?

## 2022-01-30 LAB — CBC WITH DIFFERENTIAL/PLATELET
Basophils Absolute: 0 10*3/uL (ref 0.0–0.2)
Basos: 1 %
EOS (ABSOLUTE): 0.3 10*3/uL (ref 0.0–0.4)
Eos: 5 %
Hematocrit: 39.1 % (ref 34.0–46.6)
Hemoglobin: 12.9 g/dL (ref 11.1–15.9)
Immature Grans (Abs): 0 10*3/uL (ref 0.0–0.1)
Immature Granulocytes: 0 %
Lymphocytes Absolute: 2.2 10*3/uL (ref 0.7–3.1)
Lymphs: 43 %
MCH: 29.8 pg (ref 26.6–33.0)
MCHC: 33 g/dL (ref 31.5–35.7)
MCV: 90 fL (ref 79–97)
Monocytes Absolute: 0.4 10*3/uL (ref 0.1–0.9)
Monocytes: 7 %
Neutrophils Absolute: 2.3 10*3/uL (ref 1.4–7.0)
Neutrophils: 44 %
Platelets: 421 10*3/uL (ref 150–450)
RBC: 4.33 x10E6/uL (ref 3.77–5.28)
RDW: 12.7 % (ref 11.7–15.4)
WBC: 5.2 10*3/uL (ref 3.4–10.8)

## 2022-01-30 LAB — LIPID PANEL
Chol/HDL Ratio: 3.4 ratio (ref 0.0–4.4)
Cholesterol, Total: 197 mg/dL (ref 100–199)
HDL: 58 mg/dL (ref >39)
LDL Chol Calc (NIH): 124 mg/dL — ABNORMAL HIGH (ref 0–99)
Triglycerides: 85 mg/dL (ref 0–149)
VLDL Cholesterol Cal: 15 mg/dL (ref 5–40)

## 2022-01-30 LAB — HEMOGLOBIN A1C
Est. average glucose Bld gHb Est-mCnc: 120 mg/dL
Hgb A1c MFr Bld: 5.8 % — ABNORMAL HIGH (ref 4.8–5.6)

## 2022-01-30 LAB — CMP14+EGFR
ALT: 14 IU/L (ref 0–32)
AST: 19 IU/L (ref 0–40)
Albumin/Globulin Ratio: 1.3 (ref 1.2–2.2)
Albumin: 4.3 g/dL (ref 3.8–4.9)
Alkaline Phosphatase: 138 IU/L — ABNORMAL HIGH (ref 44–121)
BUN/Creatinine Ratio: 15 (ref 9–23)
BUN: 12 mg/dL (ref 6–24)
Bilirubin Total: 0.3 mg/dL (ref 0.0–1.2)
CO2: 27 mmol/L (ref 20–29)
Calcium: 9.8 mg/dL (ref 8.7–10.2)
Chloride: 101 mmol/L (ref 96–106)
Creatinine, Ser: 0.79 mg/dL (ref 0.57–1.00)
Globulin, Total: 3.2 g/dL (ref 1.5–4.5)
Glucose: 88 mg/dL (ref 70–99)
Potassium: 4.1 mmol/L (ref 3.5–5.2)
Sodium: 143 mmol/L (ref 134–144)
Total Protein: 7.5 g/dL (ref 6.0–8.5)
eGFR: 90 mL/min/{1.73_m2} (ref >59)

## 2022-01-30 LAB — VITAMIN D 25 HYDROXY (VIT D DEFICIENCY, FRACTURES): Vit D, 25-Hydroxy: 24.2 ng/mL — ABNORMAL LOW (ref 30.0–100.0)

## 2022-01-30 LAB — TSH: TSH: 1.42 u[IU]/mL (ref 0.450–4.500)

## 2022-02-03 ENCOUNTER — Other Ambulatory Visit: Payer: Self-pay | Admitting: Family Medicine

## 2022-02-03 MED ORDER — VITAMIN D (ERGOCALCIFEROL) 1.25 MG (50000 UNIT) PO CAPS: 50000.0000 [IU] | ORAL_CAPSULE | ORAL | 0 refills | Status: AC

## 2022-02-13 ENCOUNTER — Other Ambulatory Visit: Payer: Self-pay | Admitting: Nurse Practitioner

## 2022-02-13 DIAGNOSIS — L219 Seborrheic dermatitis, unspecified: Secondary | ICD-10-CM

## 2022-02-27 ENCOUNTER — Ambulatory Visit: Payer: BC Managed Care – PPO | Admitting: Family Medicine

## 2022-05-01 ENCOUNTER — Other Ambulatory Visit: Payer: Self-pay | Admitting: Family Medicine

## 2022-05-01 DIAGNOSIS — L219 Seborrheic dermatitis, unspecified: Secondary | ICD-10-CM

## 2022-05-01 NOTE — Telephone Encounter (Signed)
Medication Refill - Medication: Ciclopirox 1 % shampoo   Has the patient contacted their pharmacy? Yes.   (Agent: If no, request that the patient contact the pharmacy for the refill. If patient does not wish to contact the pharmacy document the reason why and proceed with request.) (Agent: If yes, when and what did the pharmacy advise?)no refills / call pcp  Preferred Pharmacy (with phone number or street name): [id number]- [place], [place] - [address] Has the patient been seen for an appointment in the last year OR does the patient have an upcoming appointment? Yes.    Agent: Please be advised that RX refills may take up to 3 business days. We ask that you follow-up with your pharmacy.

## 2022-05-01 NOTE — Telephone Encounter (Signed)
Requested medication (s) are due for refill today:   Yes  Requested medication (s) are on the active medication list:   Yes  Future visit scheduled:   No   Last ordered: 12/17/2021 120 ml, 0 refills  Returned because no protocol assigned to this medication   Requested Prescriptions  Pending Prescriptions Disp Refills   Ciclopirox 1 % shampoo 120 mL 0    Sig: Apply 1 application  topically at bedtime.     Off-Protocol Failed - 05/01/2022 11:53 AM      Failed - Medication not assigned to a protocol, review manually.      Passed - Valid encounter within last 12 months    Recent Outpatient Visits           3 months ago Annual physical exam   Primary Care at [facility], MD   4 months ago Seborrheic dermatitis of scalp   Primary Care at [facility], Kriste Basque, NP   2 years ago Bilateral impacted cerumen   Primary Care at [facility], Fenton Malling, MD   2 years ago Bleeding hemorrhoid   Primary Care at [facility], Arlie Solomons, MD   2 years ago Essential hypertension   Primary Care at [facility], Arlie Solomons, MD

## 2022-05-14 MED ORDER — CICLOPIROX 1 % EX SHAM: 1.0000 "application " | MEDICATED_SHAMPOO | Freq: Every day | CUTANEOUS | 0 refills | Status: AC

## 2022-07-08 ENCOUNTER — Other Ambulatory Visit: Payer: Self-pay | Admitting: Family Medicine

## 2022-07-08 DIAGNOSIS — I10 Essential (primary) hypertension: Secondary | ICD-10-CM

## 2022-07-08 MED ORDER — AMLODIPINE-VALSARTAN-HCTZ 10-160-12.5 MG PO TABS: 1.0000 | ORAL_TABLET | Freq: Every day | ORAL | 0 refills | Status: DC

## 2022-07-08 NOTE — Telephone Encounter (Signed)
Requested Prescriptions  Pending Prescriptions Disp Refills  . amLODIPine-Valsartan-HCTZ 10-160-12.5 MG TABS 90 tablet 0    Sig: Take 1 tablet by mouth daily.     Cardiovascular: CCB + ARB + Diuretic Combos Passed - 07/08/2022 10:17 AM      Passed - K in normal range and within 180 days    Potassium  Date Value Ref Range Status  01/29/2022 4.1 3.5 - 5.2 mmol/L Final         Passed - Na in normal range and within 180 days    Sodium  Date Value Ref Range Status  01/29/2022 143 134 - 144 mmol/L Final         Passed - Cr in normal range and within 180 days    Creatinine, Ser  Date Value Ref Range Status  01/29/2022 0.79 0.57 - 1.00 mg/dL Final         Passed - eGFR is 10 or above and within 180 days    GFR calc Af Amer  Date Value Ref Range Status  10/08/2019 120 >59 mL/min/1.73 Final   GFR calc non Af Amer  Date Value Ref Range Status  10/08/2019 104 >59 mL/min/1.73 Final   eGFR  Date Value Ref Range Status  01/29/2022 90 >59 mL/min/1.73 Final         Passed - Patient is not pregnant      Passed - Last BP in normal range    BP Readings from Last 1 Encounters:  01/29/22 138/88         Passed - Last Heart Rate in normal range    Pulse Readings from Last 1 Encounters:  01/29/22 84         Passed - Valid encounter within last 6 months    Recent Outpatient Visits          5 months ago Annual physical exam   Primary Care at [facility], MD   6 months ago Seborrheic dermatitis of scalp   Primary Care at [facility], Kriste Basque, NP   2 years ago Bilateral impacted cerumen   Primary Care at [facility], Fenton Malling, MD   2 years ago Bleeding hemorrhoid   Primary Care at [facility], Arlie Solomons, MD   2 years ago Essential hypertension   Primary Care at [facility], Arlie Solomons, MD

## 2022-07-08 NOTE — Telephone Encounter (Signed)
Medication Refill - Medication: amLODIPine-Valsartan-HCTZ 10-160-12.5 MG TABS  Has the patient contacted their pharmacy? Yes.    (Agent: If yes, when and what did the pharmacy advise?) call doctor's office  Preferred Pharmacy (with phone number or street name):  [id number] [place], [place] - [address] Phone:  [phone] Fax:  [phone]    Has the patient been seen for an appointment in the last year OR does the patient have an upcoming appointment? Yes.    Agent: Please be advised that RX refills may take up to 3 business days. We ask that you follow-up with your pharmacy.

## 2022-07-10 ENCOUNTER — Telehealth: Payer: Self-pay | Admitting: Family Medicine

## 2022-07-10 NOTE — Telephone Encounter (Signed)
Patient preferred pharmacy has been updated to reflect   [id number] [place], [place] - [address] Phone:  [phone] Fax:  [phone]    Please send all prescriptions to the above and not to [facility] [address].

## 2022-10-30 ENCOUNTER — Other Ambulatory Visit: Payer: Self-pay | Admitting: Family Medicine

## 2022-10-30 DIAGNOSIS — I10 Essential (primary) hypertension: Secondary | ICD-10-CM

## 2022-12-15 ENCOUNTER — Other Ambulatory Visit: Payer: Self-pay | Admitting: Family Medicine

## 2022-12-15 DIAGNOSIS — I10 Essential (primary) hypertension: Secondary | ICD-10-CM

## 2022-12-25 ENCOUNTER — Other Ambulatory Visit: Payer: Self-pay | Admitting: *Deleted

## 2022-12-25 DIAGNOSIS — I10 Essential (primary) hypertension: Secondary | ICD-10-CM

## 2022-12-25 MED ORDER — AMLODIPINE-VALSARTAN-HCTZ 10-160-12.5 MG PO TABS: 1.0000 | ORAL_TABLET | Freq: Every day | ORAL | 0 refills | Status: DC

## 2023-01-23 ENCOUNTER — Other Ambulatory Visit: Payer: Self-pay | Admitting: Family Medicine

## 2023-01-23 DIAGNOSIS — I10 Essential (primary) hypertension: Secondary | ICD-10-CM

## 2023-01-27 NOTE — Progress Notes (Signed)
Erroneous encounter-disregard

## 2023-02-05 ENCOUNTER — Encounter: Payer: BC Managed Care – PPO | Admitting: Family

## 2023-02-10 NOTE — Progress Notes (Signed)
Erroneous encounter-disregard

## 2023-02-11 ENCOUNTER — Ambulatory Visit: Payer: BC Managed Care – PPO | Admitting: Family

## 2023-02-19 ENCOUNTER — Encounter: Payer: Self-pay | Admitting: Family Medicine

## 2023-02-19 ENCOUNTER — Encounter: Payer: BC Managed Care – PPO | Admitting: Family

## 2023-02-19 ENCOUNTER — Ambulatory Visit (INDEPENDENT_AMBULATORY_CARE_PROVIDER_SITE_OTHER): Payer: BC Managed Care – PPO | Admitting: Family Medicine

## 2023-02-19 DIAGNOSIS — I10 Essential (primary) hypertension: Secondary | ICD-10-CM | POA: Diagnosis not present

## 2023-02-19 MED ORDER — AMLODIPINE-VALSARTAN-HCTZ 10-160-12.5 MG PO TABS: 1.0000 | ORAL_TABLET | Freq: Every day | ORAL | 1 refills | Status: DC

## 2023-02-19 MED ORDER — ALPRAZOLAM 0.25 MG PO TABS: 0.2500 mg | ORAL_TABLET | Freq: Two times a day (BID) | ORAL | 0 refills | Status: DC | PRN

## 2023-02-24 ENCOUNTER — Encounter: Payer: Self-pay | Admitting: Family Medicine

## 2023-02-24 NOTE — Progress Notes (Signed)
Established Patient Office Visit  Subjective    Patient ID: Tammy Brock, female    DOB: 01/25/1971  Age: 52 y.o. MRN: [id number]  CC: No chief complaint on file.   HPI ADAM SCHEPPS presents for follow up of hypertension. Patient denies acute complaints or concerns.    Outpatient Encounter Medications as of 02/19/2023  Medication Sig   ALPRAZolam (XANAX) 0.25 MG tablet Take 1 tablet (0.25 mg total) by mouth 2 (two) times daily as needed for anxiety.   aluminum chloride (DRYSOL) 20 % external solution    cetirizine (ZYRTEC ALLERGY) 10 MG tablet Take 1 tablet (10 mg total) by mouth daily.   Ciclopirox 1 % shampoo Apply 1 application  topically at bedtime.   fluticasone (FLONASE) 50 MCG/ACT nasal spray Place 2 sprays into both nostrils daily.   Loteprednol Etabonate (LOTEMAX) 0.5 % GEL INSTILL 1 - 2 DROPS INTO THE LOWER CONJUNCTIVAL SAC of both eyes BID x 1 week   norethindrone (AYGESTIN) 5 MG tablet Take 5 mg by mouth daily.   pseudoephedrine (SUDAFED) 30 MG tablet Take 1 tablet (30 mg total) by mouth every 8 (eight) hours as needed for congestion.   Vitamin D, Ergocalciferol, (DRISDOL) 1.25 MG (50000 UNIT) CAPS capsule Take 1 capsule (50,000 Units total) by mouth every 7 (seven) days.   [DISCONTINUED] amLODIPine-Valsartan-HCTZ 10-160-12.5 MG TABS Take 1 tablet by mouth daily.   amLODIPine-Valsartan-HCTZ 10-160-12.5 MG TABS Take 1 tablet by mouth daily.   No facility-administered encounter medications on file as of 02/19/2023.    Past Medical History:  Diagnosis Date   Anemia    Hypertension    maintained on exforge    Past Surgical History:  Procedure Laterality Date   CESAREAN SECTION     DILATION AND CURETTAGE OF UTERUS  07/2008   ablation   ganglion cyst removal Left    wrist   LAPAROSCOPIC SUPRACERVICAL HYSTERECTOMY  10/08/2011   Procedure: LAPAROSCOPIC SUPRACERVICAL HYSTERECTOMY;  Surgeon: Zenaida Niece, MD;  Location: [facility];  Service: Gynecology;  Laterality:  N/A;    Family History  Problem Relation Age of Onset   Hypertension Mother    Leukemia Mother    Hypertension Father    Hypertension Sister    Hypertension Brother    Hypertension Sister    Colon cancer Neg Hx    Esophageal cancer Neg Hx    Liver disease Neg Hx    Pancreatic cancer Neg Hx    Stomach cancer Neg Hx     Social History   Socioeconomic History   Marital status: Married    Spouse name: Not on file   Number of children: Not on file   Years of education: Not on file   Highest education level: Not on file  Occupational History   Occupation: Educator  Tobacco Use   Smoking status: Never   Smokeless tobacco: Never  Vaping Use   Vaping Use: Never used  Substance and Sexual Activity   Alcohol use: No   Drug use: No   Sexual activity: Not on file  Other Topics Concern   Not on file  Social History Narrative   Not on file   Social Determinants of Health   Financial Resource Strain: Not on file  Food Insecurity: Not on file  Transportation Needs: Not on file  Physical Activity: Not on file  Stress: Not on file  Social Connections: Not on file  Intimate Partner Violence: Not on file    Review of Systems  All other systems reviewed and are negative.       Objective    BP 136/82   Pulse 81   Temp 98.1 F (36.7 C) (Oral)   Resp 16   Wt 208 lb 9.6 oz (94.6 kg)   LMP 09/21/2011   SpO2 98%   BMI 33.67 kg/m   Physical Exam Vitals and nursing note reviewed.  Constitutional:      General: She is not in acute distress. Cardiovascular:     Rate and Rhythm: Normal rate and regular rhythm.  Pulmonary:     Effort: Pulmonary effort is normal.     Breath sounds: Normal breath sounds.  Musculoskeletal:     Right lower leg: No edema.     Left lower leg: No edema.  Neurological:     General: No focal deficit present.     Mental Status: She is alert and oriented to person, place, and time.         Assessment & Plan:   1. Essential  hypertension Improved. Continue present management. Meds refilled.  - amLODIPine-Valsartan-HCTZ 10-160-12.5 MG TABS; Take 1 tablet by mouth daily.  Dispense: 90 tablet; Refill: 1    Return in about 6 months (around 08/22/2023) for follow up.   Tommie Raymond, MD

## 2023-08-24 ENCOUNTER — Ambulatory Visit: Payer: BC Managed Care – PPO | Admitting: Family Medicine

## 2023-08-30 ENCOUNTER — Ambulatory Visit: Payer: BC Managed Care – PPO | Admitting: Family Medicine

## 2023-09-06 ENCOUNTER — Encounter: Payer: Self-pay | Admitting: Family Medicine

## 2023-09-06 ENCOUNTER — Ambulatory Visit: Payer: BC Managed Care – PPO | Admitting: Family Medicine

## 2023-09-06 VITALS — BP 153/90 | HR 77 | Temp 98.2°F | Resp 16 | Ht 64.5 in | Wt 202.2 lb

## 2023-09-06 DIAGNOSIS — I10 Essential (primary) hypertension: Secondary | ICD-10-CM

## 2023-09-06 DIAGNOSIS — Z23 Encounter for immunization: Secondary | ICD-10-CM | POA: Diagnosis not present

## 2023-09-06 DIAGNOSIS — L219 Seborrheic dermatitis, unspecified: Secondary | ICD-10-CM | POA: Diagnosis not present

## 2023-09-06 MED ORDER — AMLODIPINE-VALSARTAN-HCTZ 10-160-25 MG PO TABS: 1.0000 | ORAL_TABLET | Freq: Every day | ORAL | 0 refills | Status: DC

## 2023-09-06 MED ORDER — ALPRAZOLAM 0.25 MG PO TABS: 0.2500 mg | ORAL_TABLET | Freq: Two times a day (BID) | ORAL | 0 refills | Status: AC | PRN

## 2023-09-08 ENCOUNTER — Encounter: Payer: Self-pay | Admitting: Family Medicine

## 2023-09-08 NOTE — Progress Notes (Signed)
Established Patient Office Visit  Subjective    Patient ID: Tammy Brock, female    DOB: [birth year]  Age: 52 y.o. MRN: [id number]  CC:  Chief Complaint  Patient presents with   Follow-up    HPI Tammy Brock presents for routine follow up of hypertension. Patient also reports that she continues to have issues with her scalp dermatitis but on ly uses the shampoos 2-3 times per months.   Outpatient Encounter Medications as of 09/06/2023  Medication Sig   aluminum chloride (DRYSOL) 20 % external solution    amLODIPine-Valsartan-HCTZ 10-160-25 MG TABS Take 1 tablet by mouth daily.   cetirizine (ZYRTEC ALLERGY) 10 MG tablet Take 1 tablet (10 mg total) by mouth daily.   Ciclopirox 1 % shampoo Apply 1 application  topically at bedtime.   fluticasone (FLONASE) 50 MCG/ACT nasal spray Place 2 sprays into both nostrils daily.   Loteprednol Etabonate (LOTEMAX) 0.5 % GEL INSTILL 1 - 2 DROPS INTO THE LOWER CONJUNCTIVAL SAC of both eyes BID x 1 week   norethindrone (AYGESTIN) 5 MG tablet Take 5 mg by mouth daily.   pseudoephedrine (SUDAFED) 30 MG tablet Take 1 tablet (30 mg total) by mouth every 8 (eight) hours as needed for congestion.   Vitamin D, Ergocalciferol, (DRISDOL) 1.25 MG (50000 UNIT) CAPS capsule Take 1 capsule (50,000 Units total) by mouth every 7 (seven) days.   [DISCONTINUED] ALPRAZolam (XANAX) 0.25 MG tablet Take 1 tablet (0.25 mg total) by mouth 2 (two) times daily as needed for anxiety.   [DISCONTINUED] amLODIPine-Valsartan-HCTZ 10-160-12.5 MG TABS Take 1 tablet by mouth daily.   ALPRAZolam (XANAX) 0.25 MG tablet Take 1 tablet (0.25 mg total) by mouth 2 (two) times daily as needed for anxiety.   No facility-administered encounter medications on file as of 09/06/2023.    Past Medical History:  Diagnosis Date   Anemia    Hypertension    maintained on exforge    Past Surgical History:  Procedure Laterality Date   CESAREAN SECTION     DILATION AND CURETTAGE OF UTERUS   07/2008   ablation   ganglion cyst removal Left    wrist   LAPAROSCOPIC SUPRACERVICAL HYSTERECTOMY  10/08/2011   Procedure: LAPAROSCOPIC SUPRACERVICAL HYSTERECTOMY;  Surgeon: Zenaida Niece, MD;  Location: [facility];  Service: Gynecology;  Laterality: N/A;    Family History  Problem Relation Age of Onset   Hypertension Mother    Leukemia Mother    Hypertension Father    Hypertension Sister    Hypertension Brother    Hypertension Sister    Colon cancer Neg Hx    Esophageal cancer Neg Hx    Liver disease Neg Hx    Pancreatic cancer Neg Hx    Stomach cancer Neg Hx     Social History   Socioeconomic History   Marital status: Married    Spouse name: Not on file   Number of children: Not on file   Years of education: Not on file   Highest education level: Not on file  Occupational History   Occupation: Educator  Tobacco Use   Smoking status: Never   Smokeless tobacco: Never  Vaping Use   Vaping status: Never Used  Substance and Sexual Activity   Alcohol use: No   Drug use: No   Sexual activity: Not on file  Other Topics Concern   Not on file  Social History Narrative   Not on file   Social Determinants of Health   Financial  Resource Strain: Low Risk  (09/06/2023)   Overall Financial Resource Strain (CARDIA)    Difficulty of Paying Living Expenses: Not hard at all  Food Insecurity: No Food Insecurity (09/06/2023)   Hunger Vital Sign    Worried About Running Out of Food in the Last Year: Never true    Ran Out of Food in the Last Year: Never true  Transportation Needs: No Transportation Needs (09/06/2023)   PRAPARE - Administrator, Civil Service (Medical): No    Lack of Transportation (Non-Medical): No  Physical Activity: Sufficiently Active (09/06/2023)   Exercise Vital Sign    Days of Exercise per Week: 5 days    Minutes of Exercise per Session: 30 min  Stress: No Stress Concern Present (09/06/2023)   [name] of Occupational Health -  Occupational Stress Questionnaire    Feeling of Stress : Only a little  Social Connections: Socially Integrated (09/06/2023)   Social Connection and Isolation Panel [NHANES]    Frequency of Communication with Friends and Family: More than three times a week    Frequency of Social Gatherings with Friends and Family: Three times a week    Attends Religious Services: More than 4 times per year    Active Member of Clubs or Organizations: Yes    Attends Banker Meetings: More than 4 times per year    Marital Status: Married  Catering manager Violence: Not At Risk (09/06/2023)   Humiliation, Afraid, Rape, and Kick questionnaire    Fear of Current or Ex-Partner: No    Emotionally Abused: No    Physically Abused: No    Sexually Abused: No    Review of Systems  All other systems reviewed and are negative.       Objective    BP (!) 153/90 (BP Location: Right Arm, Patient Position: Sitting, Cuff Size: Normal)   Pulse 77   Temp 98.2 F (36.8 C) (Oral)   Resp 16   Ht 5' 4.5" (1.638 m)   Wt 202 lb 3.2 oz (91.7 kg)   LMP 09/21/2011   SpO2 98%   BMI 34.17 kg/m   Physical Exam Vitals and nursing note reviewed.  Constitutional:      General: She is not in acute distress. Cardiovascular:     Rate and Rhythm: Normal rate and regular rhythm.  Pulmonary:     Effort: Pulmonary effort is normal.     Breath sounds: Normal breath sounds.  Musculoskeletal:     Right lower leg: No edema.     Left lower leg: No edema.  Neurological:     General: No focal deficit present.     Mental Status: She is alert and oriented to person, place, and time.         Assessment & Plan:   1. Essential hypertension Slightly elevated readings. Will increase med form 10/160/12.5 to 10/160/25  2. Seborrheic dermatitis of scalp Encouraged more regular and consistent use of shampoos.   3. Encounter for immunization  - Flu vaccine trivalent PF, 6mos and  older(Flulaval,Afluria,Fluarix,Fluzone)    No follow-ups on file.   Tommie Raymond, MD

## 2023-10-11 ENCOUNTER — Ambulatory Visit: Payer: BC Managed Care – PPO | Admitting: Family Medicine

## 2024-01-09 ENCOUNTER — Other Ambulatory Visit: Payer: Self-pay | Admitting: Family Medicine

## 2024-01-18 ENCOUNTER — Ambulatory Visit: Payer: Self-pay

## 2024-01-18 NOTE — Telephone Encounter (Signed)
 Chief Complaint: Dizziness Symptoms: severe Dizziness, nausea  Frequency: constant for about 4 hours yesterday  Pertinent Negatives: Patient denies current symptoms Disposition: [] ED /[] Urgent Care (no appt availability in office) / [x] Appointment(In office/virtual)/ []  [facility]/ [] Home Care/ [] Refused Recommended Disposition /[] [facility]/ []  Follow-up with PCP Additional Notes: Patient states she felt dizzy for about 4 hours yesterday like room was spinning. She reported when she opened her eyes the feeling was worse. Patient reported sleeping most of the morning until she started to feel better. Patient denies current symptoms. Care advice was given and patient was scheduled for first available appointment with PCP and added to wait list for sooner appointment. Advised patient to call back if symptoms return. Patient verbalized understanding. 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  Copied from CRM [id number]. Topic: Appointments - Appointment Scheduling >> Jan 18, 2024  8:57 AM Louie Casa B wrote: Patient/patient representative is calling to schedule an appointment. Refer to attachments for appointment information.  Patient is having dizziness Reason for Disposition  [1] MILD dizziness (e.g., vertigo; walking normally) AND [2] has NOT been evaluated by doctor (or NP/PA) for this  Answer Assessment - Initial Assessment Questions 1. DESCRIPTION: "Describe your dizziness."     Like room was spinning 2. VERTIGO: "Do you feel like either you or the room is spinning or tilting?"      Yes it felt like the room was spinning  3. LIGHTHEADED: "Do you feel lightheaded?" (e.g., somewhat faint, woozy, weak upon standing)     Woozy 4. SEVERITY: "How bad is it?"  "Can you walk?"   - MILD: Feels slightly dizzy and unsteady, but is walking normally.   - MODERATE: Feels  unsteady when walking, but not falling; interferes with normal activities (e.g., school, work).   - SEVERE: Unable to walk without falling, or requires assistance to walk without falling.     Severe for about 4 hours  5. ONSET:  "When did the dizziness begin?"     Yesterday  6. AGGRAVATING FACTORS: "Does anything make it worse?" (e.g., standing, change in head position)     Opening my eyes made it worse  7. CAUSE: "What do you think is causing the dizziness?"     Unsure  8. RECURRENT SYMPTOM: "Have you had dizziness before?" If Yes, ask: "When was the last time?" "What happened that time?"     It happen a few years ago  9. OTHER SYMPTOMS: "Do you have any other symptoms?" (e.g., headache, weakness, numbness, vomiting, earache)     Nausea, headache  Protocols used: Dizziness - Vertigo-A-AH

## 2024-02-23 ENCOUNTER — Ambulatory Visit (INDEPENDENT_AMBULATORY_CARE_PROVIDER_SITE_OTHER): Payer: Self-pay | Admitting: Family Medicine

## 2024-02-23 VITALS — BP 160/92 | HR 79 | Temp 98.1°F | Resp 16 | Ht 65.0 in | Wt 203.4 lb

## 2024-02-23 DIAGNOSIS — L304 Erythema intertrigo: Secondary | ICD-10-CM

## 2024-02-23 DIAGNOSIS — I10 Essential (primary) hypertension: Secondary | ICD-10-CM | POA: Diagnosis not present

## 2024-02-23 DIAGNOSIS — J309 Allergic rhinitis, unspecified: Secondary | ICD-10-CM

## 2024-02-23 DIAGNOSIS — R42 Dizziness and giddiness: Secondary | ICD-10-CM

## 2024-02-23 DIAGNOSIS — B372 Candidiasis of skin and nail: Secondary | ICD-10-CM | POA: Diagnosis not present

## 2024-02-23 MED ORDER — MECLIZINE HCL 25 MG PO TABS: 25.0000 mg | ORAL_TABLET | Freq: Two times a day (BID) | ORAL | 1 refills | Status: AC | PRN

## 2024-02-23 NOTE — Progress Notes (Unsigned)
 Established Patient Office Visit  Subjective    Patient ID: Tammy Brock, female    DOB: 1971/09/20  Age: 53 y.o. MRN: [id number]  CC: No chief complaint on file.   HPI Tammy Brock presents for complaint of episode of dizziness. Feeling was of a room spinning. Patient also had nausea. Patient did not take any med for sx.   Outpatient Encounter Medications as of 02/23/2024  Medication Sig   amLODIPine -Valsartan -HCTZ 10-160-25 MG TABS TAKE 1 TABLET BY MOUTH EVERY DAY   meclizine (ANTIVERT) 25 MG tablet Take 1 tablet (25 mg total) by mouth 2 (two) times daily as needed for dizziness.   ALPRAZolam  (XANAX ) 0.25 MG tablet Take 1 tablet (0.25 mg total) by mouth 2 (two) times daily as needed for anxiety. (Patient not taking: Reported on 02/23/2024)   aluminum chloride (DRYSOL) 20 % external solution  (Patient not taking: Reported on 02/23/2024)   cetirizine  (ZYRTEC  ALLERGY) 10 MG tablet Take 1 tablet (10 mg total) by mouth daily. (Patient not taking: Reported on 02/23/2024)   Ciclopirox  1 % shampoo Apply 1 application  topically at bedtime. (Patient not taking: Reported on 02/23/2024)   fluticasone  (FLONASE ) 50 MCG/ACT nasal spray Place 2 sprays into both nostrils daily. (Patient not taking: Reported on 02/23/2024)   Loteprednol Etabonate (LOTEMAX) 0.5 % GEL INSTILL 1 - 2 DROPS INTO THE LOWER CONJUNCTIVAL SAC of both eyes BID x 1 week (Patient not taking: Reported on 02/23/2024)   norethindrone (AYGESTIN) 5 MG tablet Take 5 mg by mouth daily. (Patient not taking: Reported on 02/23/2024)   pseudoephedrine  (SUDAFED) 30 MG tablet Take 1 tablet (30 mg total) by mouth every 8 (eight) hours as needed for congestion. (Patient not taking: Reported on 02/23/2024)   Vitamin D , Ergocalciferol , (DRISDOL ) 1.25 MG (50000 UNIT) CAPS capsule Take 1 capsule (50,000 Units total) by mouth every 7 (seven) days. (Patient not taking: Reported on 02/23/2024)   No facility-administered encounter medications on file as of 02/23/2024.     Past Medical History:  Diagnosis Date   Anemia    Hypertension    maintained on exforge     Past Surgical History:  Procedure Laterality Date   CESAREAN SECTION     DILATION AND CURETTAGE OF UTERUS  07/2008   ablation   ganglion cyst removal Left    wrist   LAPAROSCOPIC SUPRACERVICAL HYSTERECTOMY  10/08/2011   Procedure: LAPAROSCOPIC SUPRACERVICAL HYSTERECTOMY;  Surgeon: Lillie Reining, MD;  Location: [facility];  Service: Gynecology;  Laterality: N/A;    Family History  Problem Relation Age of Onset   Hypertension Mother    Leukemia Mother    Hypertension Father    Hypertension Sister    Hypertension Brother    Hypertension Sister    Colon cancer Neg Hx    Esophageal cancer Neg Hx    Liver disease Neg Hx    Pancreatic cancer Neg Hx    Stomach cancer Neg Hx     Social History   Socioeconomic History   Marital status: Married    Spouse name: Not on file   Number of children: Not on file   Years of education: Not on file   Highest education level: Not on file  Occupational History   Occupation: Educator  Tobacco Use   Smoking status: Never   Smokeless tobacco: Never  Vaping Use   Vaping status: Never Used  Substance and Sexual Activity   Alcohol use: No   Drug use: No   Sexual activity:  Not on file  Other Topics Concern   Not on file  Social History Narrative   Not on file   Social Drivers of Health   Financial Resource Strain: Low Risk  (09/06/2023)   Overall Financial Resource Strain (CARDIA)    Difficulty of Paying Living Expenses: Not hard at all  Food Insecurity: No Food Insecurity (09/06/2023)   Hunger Vital Sign    Worried About Running Out of Food in the Last Year: Never true    Ran Out of Food in the Last Year: Never true  Transportation Needs: No Transportation Needs (09/06/2023)   PRAPARE - Administrator, Civil Service (Medical): No    Lack of Transportation (Non-Medical): No  Physical Activity: Sufficiently Active  (09/06/2023)   Exercise Vital Sign    Days of Exercise per Week: 5 days    Minutes of Exercise per Session: 30 min  Stress: No Stress Concern Present (09/06/2023)   [name] of Occupational Health - Occupational Stress Questionnaire    Feeling of Stress : Only a little  Social Connections: Socially Integrated (09/06/2023)   Social Connection and Isolation Panel [NHANES]    Frequency of Communication with Friends and Family: More than three times a week    Frequency of Social Gatherings with Friends and Family: Three times a week    Attends Religious Services: More than 4 times per year    Active Member of Clubs or Organizations: Yes    Attends Banker Meetings: More than 4 times per year    Marital Status: Married  Catering manager Violence: Not At Risk (09/06/2023)   Humiliation, Afraid, Rape, and Kick questionnaire    Fear of Current or Ex-Partner: No    Emotionally Abused: No    Physically Abused: No    Sexually Abused: No    Review of Systems  All other systems reviewed and are negative.       Objective    BP (!) 160/92   Pulse 79   Temp 98.1 F (36.7 C) (Oral)   Resp 16   Ht 5\' 5"  (1.651 m)   Wt 203 lb 6.4 oz (92.3 kg)   LMP 09/21/2011   SpO2 98%   BMI 33.85 kg/m   Physical Exam Vitals and nursing note reviewed.  Constitutional:      General: She is not in acute distress. HENT:     Head: Normocephalic and atraumatic.     Right Ear: Tympanic membrane normal.     Left Ear: Tympanic membrane normal.  Cardiovascular:     Rate and Rhythm: Normal rate and regular rhythm.  Pulmonary:     Effort: Pulmonary effort is normal.     Breath sounds: Normal breath sounds.  Abdominal:     Palpations: Abdomen is soft.     Tenderness: There is no abdominal tenderness.  Neurological:     General: No focal deficit present.     Mental Status: She is alert and oriented to person, place, and time.         Assessment & Plan:   1. Vertigo  (Primary) Meclizine prescribed.   2. Uncontrolled hypertension Discussed compliance. Patient defers change of agent at this time  3. Allergic rhinitis, unspecified seasonality, unspecified trigger Patient to continue flonase  and zyrtec  prn  4. Candidal intertrigo Discussed skin care.     No follow-ups on file.   Arlo Lama, MD

## 2024-02-24 ENCOUNTER — Encounter: Payer: Self-pay | Admitting: Family Medicine

## 2024-06-04 ENCOUNTER — Other Ambulatory Visit: Payer: Self-pay | Admitting: Family Medicine

## 2024-06-14 ENCOUNTER — Ambulatory Visit: Admitting: Family Medicine

## 2024-07-15 ENCOUNTER — Other Ambulatory Visit: Payer: Self-pay | Admitting: Family Medicine

## 2024-07-26 ENCOUNTER — Ambulatory Visit: Admitting: Family Medicine

## 2024-09-13 ENCOUNTER — Other Ambulatory Visit: Payer: Self-pay | Admitting: Family Medicine

## 2024-09-13 NOTE — Telephone Encounter (Signed)
 Pt scheduled

## 2024-10-22 ENCOUNTER — Other Ambulatory Visit: Payer: Self-pay | Admitting: Family Medicine

## 2024-11-07 ENCOUNTER — Ambulatory Visit: Admitting: Family Medicine

## 2024-11-07 ENCOUNTER — Encounter: Payer: Self-pay | Admitting: Family Medicine

## 2024-11-07 VITALS — BP 140/76 | HR 75 | Ht 65.0 in | Wt 202.4 lb

## 2024-11-07 DIAGNOSIS — E6609 Other obesity due to excess calories: Secondary | ICD-10-CM

## 2024-11-07 DIAGNOSIS — I10 Essential (primary) hypertension: Secondary | ICD-10-CM

## 2024-11-07 DIAGNOSIS — N951 Menopausal and female climacteric states: Secondary | ICD-10-CM

## 2024-11-07 DIAGNOSIS — Z6833 Body mass index (BMI) 33.0-33.9, adult: Secondary | ICD-10-CM

## 2024-11-07 DIAGNOSIS — E66811 Obesity, class 1: Secondary | ICD-10-CM

## 2024-11-07 NOTE — Progress Notes (Signed)
 "  Established Patient Office Visit  Subjective    Patient ID: Tammy Brock, female    DOB: 1971-06-14  Age: 54 y.o. MRN: [id number]  CC:  Chief Complaint  Patient presents with   Medical Management of Chronic Issues    Pt is interested in getting blood work done and also has questions for Dr. Tanda regarding menopause and GLP1s    HPI Tammy Brock presents for follow up of hypertension and perimenopausal symptoms. Patient reports that she has hbeen having decreased livido and intermittent hot flashes primarily.   Outpatient Encounter Medications as of 11/07/2024  Medication Sig   amLODIPine -Valsartan -HCTZ 10-160-25 MG TABS TAKE 1 TABLET BY MOUTH EVERY DAY   ALPRAZolam  (XANAX ) 0.25 MG tablet Take 1 tablet (0.25 mg total) by mouth 2 (two) times daily as needed for anxiety. (Patient not taking: Reported on 02/23/2024)   aluminum chloride (DRYSOL) 20 % external solution  (Patient not taking: Reported on 02/23/2024)   cetirizine  (ZYRTEC  ALLERGY) 10 MG tablet Take 1 tablet (10 mg total) by mouth daily. (Patient not taking: Reported on 02/23/2024)   Ciclopirox  1 % shampoo Apply 1 application  topically at bedtime. (Patient not taking: Reported on 02/23/2024)   fluticasone  (FLONASE ) 50 MCG/ACT nasal spray Place 2 sprays into both nostrils daily. (Patient not taking: Reported on 02/23/2024)   Loteprednol Etabonate (LOTEMAX) 0.5 % GEL INSTILL 1 - 2 DROPS INTO THE LOWER CONJUNCTIVAL SAC of both eyes BID x 1 week (Patient not taking: Reported on 02/23/2024)   meclizine  (ANTIVERT ) 25 MG tablet Take 1 tablet (25 mg total) by mouth 2 (two) times daily as needed for dizziness.   norethindrone (AYGESTIN) 5 MG tablet Take 5 mg by mouth daily. (Patient not taking: Reported on 02/23/2024)   pseudoephedrine  (SUDAFED) 30 MG tablet Take 1 tablet (30 mg total) by mouth every 8 (eight) hours as needed for congestion. (Patient not taking: Reported on 02/23/2024)   Vitamin D , Ergocalciferol , (DRISDOL ) 1.25 MG (50000 UNIT) CAPS  capsule Take 1 capsule (50,000 Units total) by mouth every 7 (seven) days. (Patient not taking: Reported on 02/23/2024)   No facility-administered encounter medications on file as of 11/07/2024.    Past Medical History:  Diagnosis Date   Anemia    Hypertension    maintained on exforge     Past Surgical History:  Procedure Laterality Date   CESAREAN SECTION     DILATION AND CURETTAGE OF UTERUS  07/2008   ablation   ganglion cyst removal Left    wrist   LAPAROSCOPIC SUPRACERVICAL HYSTERECTOMY  10/08/2011   Procedure: LAPAROSCOPIC SUPRACERVICAL HYSTERECTOMY;  Surgeon: Krystal JONETTA Deaner, MD;  Location: [facility];  Service: Gynecology;  Laterality: N/A;    Family History  Problem Relation Age of Onset   Hypertension Mother    Leukemia Mother    Hypertension Father    Hypertension Sister    Hypertension Brother    Hypertension Sister    Colon cancer Neg Hx    Esophageal cancer Neg Hx    Liver disease Neg Hx    Pancreatic cancer Neg Hx    Stomach cancer Neg Hx     Social History   Socioeconomic History   Marital status: Married    Spouse name: Not on file   Number of children: Not on file   Years of education: Not on file   Highest education level: Not on file  Occupational History   Occupation: Educator  Tobacco Use   Smoking status: Never  Smokeless tobacco: Never  Vaping Use   Vaping status: Never Used  Substance and Sexual Activity   Alcohol use: No   Drug use: No   Sexual activity: Not on file  Other Topics Concern   Not on file  Social History Narrative   Not on file   Social Drivers of Health   Tobacco Use: Low Risk (11/07/2024)   Patient History    Smoking Tobacco Use: Never    Smokeless Tobacco Use: Never    Passive Exposure: Not on file  Financial Resource Strain: Low Risk (09/06/2023)   Overall Financial Resource Strain (CARDIA)    Difficulty of Paying Living Expenses: Not hard at all  Food Insecurity: No Food Insecurity (09/06/2023)   Hunger Vital  Sign    Worried About Running Out of Food in the Last Year: Never true    Ran Out of Food in the Last Year: Never true  Transportation Needs: No Transportation Needs (09/06/2023)   PRAPARE - Administrator, Civil Service (Medical): No    Lack of Transportation (Non-Medical): No  Physical Activity: Sufficiently Active (09/06/2023)   Exercise Vital Sign    Days of Exercise per Week: 5 days    Minutes of Exercise per Session: 30 min  Stress: No Stress Concern Present (09/06/2023)   [name] of Occupational Health - Occupational Stress Questionnaire    Feeling of Stress : Only a little  Social Connections: Socially Integrated (09/06/2023)   Social Connection and Isolation Panel    Frequency of Communication with Friends and Family: More than three times a week    Frequency of Social Gatherings with Friends and Family: Three times a week    Attends Religious Services: More than 4 times per year    Active Member of Clubs or Organizations: Yes    Attends Banker Meetings: More than 4 times per year    Marital Status: Married  Catering Manager Violence: Not At Risk (09/06/2023)   Humiliation, Afraid, Rape, and Kick questionnaire    Fear of Current or Ex-Partner: No    Emotionally Abused: No    Physically Abused: No    Sexually Abused: No  Depression (PHQ2-9): Low Risk (02/23/2024)   Depression (PHQ2-9)    PHQ-2 Score: 2  Alcohol Screen: Low Risk (09/06/2023)   Alcohol Screen    Last Alcohol Screening Score (AUDIT): 0  Housing: Low Risk (09/06/2023)   Housing    Last Housing Risk Score: 0  Utilities: Not At Risk (09/06/2023)   [facility] Utilities    Threatened with loss of utilities: No  Health Literacy: Adequate Health Literacy (09/06/2023)   B1300 Health Literacy    Frequency of need for help with medical instructions: Never    Review of Systems  All other systems reviewed and are negative.       Objective    BP (!) 140/76   Pulse 75   Ht 5'  5 (1.651 m)   Wt 202 lb 6.4 oz (91.8 kg)   LMP 09/21/2011   SpO2 98%   BMI 33.68 kg/m   Physical Exam Vitals and nursing note reviewed.  Constitutional:      General: She is not in acute distress. Cardiovascular:     Rate and Rhythm: Normal rate and regular rhythm.  Pulmonary:     Effort: Pulmonary effort is normal.     Breath sounds: Normal breath sounds.  Musculoskeletal:     Right lower leg: No edema.  Left lower leg: No edema.  Neurological:     General: No focal deficit present.     Mental Status: She is alert and oriented to person, place, and time.         Assessment & Plan:   1. Essential hypertension (Primary) Continue   2. Perimenopausal Discussed dietary and activity options as well as various supplements.   3. Class 1 obesity due to excess calories with serious comorbidity and body mass index (BMI) of 33.0 to 33.9 in adult Discussed options including GLP-1 which patient will defer for now..   Return in about 4 weeks (around 12/05/2024) for follow up, physical.   Tanda Raguel SQUIBB, MD  "

## 2025-01-23 ENCOUNTER — Encounter: Payer: Self-pay | Admitting: Family Medicine
